# Patient Record
Sex: Female | Born: 1985 | Race: Black or African American | Hispanic: No | Smoking: Never smoker
Health system: Southern US, Community
[De-identification: ages and names within clinical notes are randomized; demographics above are authoritative.]

## PROBLEM LIST (undated history)

## (undated) ENCOUNTER — Inpatient Hospital Stay: Payer: Self-pay

## (undated) DIAGNOSIS — O139 Gestational [pregnancy-induced] hypertension without significant proteinuria, unspecified trimester: Secondary | ICD-10-CM

## (undated) DIAGNOSIS — T8859XA Other complications of anesthesia, initial encounter: Secondary | ICD-10-CM

## (undated) DIAGNOSIS — Z789 Other specified health status: Secondary | ICD-10-CM

## (undated) DIAGNOSIS — B999 Unspecified infectious disease: Secondary | ICD-10-CM

## (undated) DIAGNOSIS — R87629 Unspecified abnormal cytological findings in specimens from vagina: Secondary | ICD-10-CM

## (undated) DIAGNOSIS — D649 Anemia, unspecified: Secondary | ICD-10-CM

## (undated) DIAGNOSIS — R519 Headache, unspecified: Secondary | ICD-10-CM

## (undated) DIAGNOSIS — K089 Disorder of teeth and supporting structures, unspecified: Secondary | ICD-10-CM

## (undated) DIAGNOSIS — F99 Mental disorder, not otherwise specified: Secondary | ICD-10-CM

## (undated) HISTORY — DX: Unspecified infectious disease: B99.9

## (undated) HISTORY — DX: Disorder of teeth and supporting structures, unspecified: K08.9

## (undated) HISTORY — DX: Headache, unspecified: R51.9

## (undated) HISTORY — DX: Anemia, unspecified: D64.9

## (undated) HISTORY — DX: Gestational (pregnancy-induced) hypertension without significant proteinuria, unspecified trimester: O13.9

## (undated) HISTORY — PX: NO PAST SURGERIES: SHX2092

## (undated) HISTORY — DX: Unspecified abnormal cytological findings in specimens from vagina: R87.629

## (undated) HISTORY — DX: Mental disorder, not otherwise specified: F99

## (undated) HISTORY — DX: Other specified health status: Z78.9

## (undated) HISTORY — PX: OTHER SURGICAL HISTORY: SHX169

## (undated) HISTORY — DX: Other complications of anesthesia, initial encounter: T88.59XA

---

## 2007-07-19 ENCOUNTER — Emergency Department: Payer: Self-pay | Admitting: Emergency Medicine

## 2007-11-20 ENCOUNTER — Inpatient Hospital Stay: Payer: Self-pay

## 2012-05-04 ENCOUNTER — Emergency Department: Payer: Self-pay | Admitting: Emergency Medicine

## 2012-08-13 DIAGNOSIS — N879 Dysplasia of cervix uteri, unspecified: Secondary | ICD-10-CM | POA: Insufficient documentation

## 2012-10-20 ENCOUNTER — Encounter: Payer: Self-pay | Admitting: Obstetrics and Gynecology

## 2012-12-04 ENCOUNTER — Encounter: Payer: Self-pay | Admitting: Obstetrics and Gynecology

## 2012-12-05 ENCOUNTER — Encounter: Payer: Self-pay | Admitting: Obstetrics and Gynecology

## 2013-02-19 ENCOUNTER — Encounter: Payer: Self-pay | Admitting: Maternal & Fetal Medicine

## 2013-03-16 ENCOUNTER — Encounter: Payer: Self-pay | Admitting: Maternal and Fetal Medicine

## 2013-03-26 ENCOUNTER — Observation Stay: Payer: Self-pay

## 2013-03-26 LAB — URINALYSIS, COMPLETE
Bilirubin,UR: NEGATIVE
Glucose,UR: NEGATIVE mg/dL (ref 0–75)
Ketone: NEGATIVE
Nitrite: NEGATIVE
Ph: 7 (ref 4.5–8.0)
Protein: NEGATIVE
RBC,UR: NONE SEEN /HPF (ref 0–5)

## 2013-04-01 ENCOUNTER — Observation Stay: Payer: Self-pay | Admitting: Obstetrics and Gynecology

## 2013-04-06 ENCOUNTER — Encounter: Payer: Self-pay | Admitting: Maternal and Fetal Medicine

## 2013-04-15 ENCOUNTER — Observation Stay: Payer: Self-pay | Admitting: Obstetrics and Gynecology

## 2013-04-17 ENCOUNTER — Inpatient Hospital Stay: Payer: Self-pay | Admitting: Obstetrics and Gynecology

## 2013-04-17 LAB — CBC WITH DIFFERENTIAL/PLATELET
Basophil %: 0.5 %
Eosinophil #: 0.1 10*3/uL (ref 0.0–0.7)
HGB: 10.6 g/dL — ABNORMAL LOW (ref 12.0–16.0)
Lymphocyte #: 0.7 10*3/uL — ABNORMAL LOW (ref 1.0–3.6)
MCH: 24.9 pg — ABNORMAL LOW (ref 26.0–34.0)
MCHC: 31.9 g/dL — ABNORMAL LOW (ref 32.0–36.0)
MCV: 78 fL — ABNORMAL LOW (ref 80–100)
Monocyte %: 5.8 %
Neutrophil %: 89.3 %
Platelet: 259 10*3/uL (ref 150–440)
RBC: 4.28 10*6/uL (ref 3.80–5.20)

## 2014-05-07 NOTE — L&D Delivery Note (Signed)
Delivery Note At  a viable female infant was delivered via DOA.  APGAR:8,9 ; weight 2620g .  Precipitous delivery within 1 hour of presentation.  Placenta status: intact, no complications  Anesthesia:  none Episiotomy:  none Lacerations:  none Suture Repair: none Est. Blood Loss (mL):  200 ml  Mom to postpartum.  Baby to Couplet care / Skin to Skin.  Leonette MostJohanna K Black 04/16/2015, 8:19 AM

## 2014-09-14 NOTE — H&P (Signed)
L&D Evaluation:  History:  HPI 41JW [id number] G3P1102 with LMP of ?07/19/12 & EDd of 04/26/13 and US at 10 5/7 wks with EDD of 04/25/[facility] significant for Tdap given at 28 wks, H/O pp hemorrhage, H/O abnormal paps, +chlamydia with TOC neg on 09/25/12 sent from [facility] due to early cx dilation without labor. Pt also had poor weight gain during the pregnancy. No ROM, VB,decreased FM or any UC's   Presents with cx dilation   Patient's Medical History Abnormal Paps, PTD at 36 4/7 wks, +chlamydia,   Patient's Surgical History none   Medications Pre Natal Vitamins   Allergies NKDA, Sulfa   Social History none   Family History Non-Contributory   ROS:  ROS All systems were reviewed.  HEENT, CNS, GI, GU, Respiratory, CV, Renal and Musculoskeletal systems were found to be normal.   Exam:  Vital Signs stable   General no apparent distress   Mental Status clear   Chest clear   Heart normal sinus rhythm, no murmur/gallop/rubs   Abdomen gravid, non-tender   Estimated Fetal Weight Large for gestational age   Edema 1+   Reflexes 1+   Mebranes Intact   FHT normal rate with no decels, reactive NST with 2 accels 15 x 15   Ucx other   Lymph no lymphadenopathy   Plan:  Plan DChome   Comments Pt taken out of work due to advanced cx dilation. Pelvic rest, No sex. note to pt GN:FAOZre:work   Electronic Signatures: Sharee PimpleJones, Caron W (CNM)  (Signed 20-Nov-14 13:44)  Authored: L&D Evaluation   Last Updated: 20-Nov-14 13:44 by Sharee PimpleJones, Caron W (CNM)

## 2014-09-14 NOTE — H&P (Signed)
L&D Evaluation:  History:  HPI 29 yo [id number] with LMP of 04/25/13 at 10 5/7 weeks here with complete dilation and ready for delivery and no paperwork done or reviewed prior to delivery. PNC significant for H/O PTD at 8536 4/7 weeks, h/o PP hemorrhage, h/o abnormal pap, h/o chlamydia, TOC neg, Pt does not have custody of her two kids, Low birth rate for this preg.   Presents with contractions   Patient's Medical History PTD, PP hemorrhage, abnormal paps, +chlamyda wtih treatment, poor wt gain   Patient's Surgical History none   Medications Pre Natal Vitamins   Allergies NKDA   Social History none   Family History Non-Contributory   ROS:  ROS All systems were reviewed.  HEENT, CNS, GI, GU, Respiratory, CV, Renal and Musculoskeletal systems were found to be normal.   Exam:  Vital Signs stable   General no apparent distress   Mental Status clear   Chest clear   Heart normal sinus rhythm, no murmur/gallop/rubs   Back no CVAT   Edema 1+   Reflexes 1+   Clonus negative   Mebranes Ruptured, 0930 clear   FHT normal rate with no decels   Ucx regular   Skin dry   Lymph no lymphadenopathy   Impression:  Impression active labor, IUP at term with active labor   Plan:  Plan monitor contractions and for cervical change, GBS neg   Electronic Signatures: Sharee PimpleJones, Veto Macqueen W (CNM)  (Signed 12-Dec-14 12:21)  Authored: L&D Evaluation   Last Updated: 12-Dec-14 12:21 by Sharee PimpleJones, Loran Fleet W (CNM)

## 2014-11-03 DIAGNOSIS — F4323 Adjustment disorder with mixed anxiety and depressed mood: Secondary | ICD-10-CM | POA: Insufficient documentation

## 2014-11-12 ENCOUNTER — Emergency Department: Payer: Medicaid Other

## 2014-11-12 ENCOUNTER — Emergency Department
Admission: EM | Admit: 2014-11-12 | Discharge: 2014-11-12 | Disposition: A | Discharge status: Home / Self Care | Payer: Medicaid Other | Attending: Emergency Medicine | Admitting: Emergency Medicine

## 2014-11-12 ENCOUNTER — Encounter: Payer: Self-pay | Admitting: Emergency Medicine

## 2014-11-12 DIAGNOSIS — R109 Unspecified abdominal pain: Secondary | ICD-10-CM | POA: Diagnosis not present

## 2014-11-12 DIAGNOSIS — O26899 Other specified pregnancy related conditions, unspecified trimester: Secondary | ICD-10-CM

## 2014-11-12 DIAGNOSIS — O9989 Other specified diseases and conditions complicating pregnancy, childbirth and the puerperium: Secondary | ICD-10-CM | POA: Insufficient documentation

## 2014-11-12 DIAGNOSIS — Z3A15 15 weeks gestation of pregnancy: Secondary | ICD-10-CM | POA: Insufficient documentation

## 2014-11-12 DIAGNOSIS — Z3491 Encounter for supervision of normal pregnancy, unspecified, first trimester: Secondary | ICD-10-CM

## 2014-11-12 LAB — CBC
HCT: 36.4 % (ref 35.0–47.0)
Hemoglobin: 11.8 g/dL — ABNORMAL LOW (ref 12.0–16.0)
MCH: 27 pg (ref 26.0–34.0)
MCHC: 32.3 g/dL (ref 32.0–36.0)
MCV: 83.6 fL (ref 80.0–100.0)
Platelets: 252 10*3/uL (ref 150–440)
RBC: 4.35 MIL/uL (ref 3.80–5.20)
RDW: 15.7 % — AB (ref 11.5–14.5)
WBC: 10.1 10*3/uL (ref 3.6–11.0)

## 2014-11-12 LAB — ABO/RH
ABO/RH(D): O POS
ABO/RH(D): O POS

## 2014-11-12 LAB — HCG, QUANTITATIVE, PREGNANCY: hCG, Beta Chain, Quant, S: 71060 m[IU]/mL — ABNORMAL HIGH (ref <5)

## 2014-11-12 MED ORDER — ACETAMINOPHEN 500 MG PO TABS
ORAL_TABLET | ORAL | Status: AC
  Filled 2014-11-12: qty 2

## 2014-11-12 MED ORDER — ACETAMINOPHEN 500 MG PO TABS
1000.0000 mg | ORAL_TABLET | ORAL | Status: AC
  Administered 2014-11-12: 1000 mg via ORAL

## 2014-11-12 NOTE — ED Notes (Signed)
Pt discharged home after verbalizing understanding of discharge instructions; nad noted. 

## 2014-11-12 NOTE — Discharge Instructions (Signed)
First Trimester of Pregnancy  Follow-up with the health Department on Monday. Return to the ER right away should you develop leak of fluid from the vagina, vaginal bleeding, severe abdominal pain, fever, cramps, weakness, or other new concerns arise.  The first trimester of pregnancy is from week 1 until the end of week 12 (months 1 through 3). A week after a sperm fertilizes an egg, the egg will implant on the wall of the uterus. This embryo will begin to develop into a baby. Genes from you and your partner are forming the baby. The female genes determine whether the baby is a boy or a girl. At 6-8 weeks, the eyes and face are formed, and the heartbeat can be seen on ultrasound. At the end of 12 weeks, all the baby's organs are formed.  Now that you are pregnant, you will want to do everything you can to have a healthy baby. Two of the most important things are to get good prenatal care and to follow your health care provider's instructions. Prenatal care is all the medical care you receive before the baby's birth. This care will help prevent, find, and treat any problems during the pregnancy and childbirth. BODY CHANGES Your body goes through many changes during pregnancy. The changes vary from woman to woman.   You may gain or lose a couple of pounds at first.  You may feel sick to your stomach (nauseous) and throw up (vomit). If the vomiting is uncontrollable, call your health care provider.  You may tire easily.  You may develop headaches that can be relieved by medicines approved by your health care provider.  You may urinate more often. Painful urination may mean you have a bladder infection.  You may develop heartburn as a result of your pregnancy.  You may develop constipation because certain hormones are causing the muscles that push waste through your intestines to slow down.  You may develop hemorrhoids or swollen, bulging veins (varicose veins).  Your breasts may begin to grow  larger and become tender. Your nipples may stick out more, and the tissue that surrounds them (areola) may become darker.  Your gums may bleed and may be sensitive to brushing and flossing.  Dark spots or blotches (chloasma, mask of pregnancy) may develop on your face. This will likely fade after the baby is born.  Your menstrual periods will stop.  You may have a loss of appetite.  You may develop cravings for certain kinds of food.  You may have changes in your emotions from day to day, such as being excited to be pregnant or being concerned that something may go wrong with the pregnancy and baby.  You may have more vivid and strange dreams.  You may have changes in your hair. These can include thickening of your hair, rapid growth, and changes in texture. Some women also have hair loss during or after pregnancy, or hair that feels dry or thin. Your hair will most likely return to normal after your baby is born. WHAT TO EXPECT AT YOUR PRENATAL VISITS During a routine prenatal visit:  You will be weighed to make sure you and the baby are growing normally.  Your blood pressure will be taken.  Your abdomen will be measured to track your baby's growth.  The fetal heartbeat will be listened to starting around week 10 or 12 of your pregnancy.  Test results from any previous visits will be discussed. Your health care provider may ask you:  How  you are feeling.  If you are feeling the baby move.  If you have had any abnormal symptoms, such as leaking fluid, bleeding, severe headaches, or abdominal cramping.  If you have any questions. Other tests that may be performed during your first trimester include:  Blood tests to find your blood type and to check for the presence of any previous infections. They will also be used to check for low iron levels (anemia) and Rh antibodies. Later in the pregnancy, blood tests for diabetes will be done along with other tests if problems  develop.  Urine tests to check for infections, diabetes, or protein in the urine.  An ultrasound to confirm the proper growth and development of the baby.  An amniocentesis to check for possible genetic problems.  Fetal screens for spina bifida and Down syndrome.  You may need other tests to make sure you and the baby are doing well. HOME CARE INSTRUCTIONS  Medicines  Follow your health care provider's instructions regarding medicine use. Specific medicines may be either safe or unsafe to take during pregnancy.  Take your prenatal vitamins as directed.  If you develop constipation, try taking a stool softener if your health care provider approves. Diet  Eat regular, well-balanced meals. Choose a variety of foods, such as meat or vegetable-based protein, fish, milk and low-fat dairy products, vegetables, fruits, and whole grain breads and cereals. Your health care provider will help you determine the amount of weight gain that is right for you.  Avoid raw meat and uncooked cheese. These carry germs that can cause birth defects in the baby.  Eating four or five small meals rather than three large meals a day may help relieve nausea and vomiting. If you start to feel nauseous, eating a few soda crackers can be helpful. Drinking liquids between meals instead of during meals also seems to help nausea and vomiting.  If you develop constipation, eat more high-fiber foods, such as fresh vegetables or fruit and whole grains. Drink enough fluids to keep your urine clear or pale yellow. Activity and Exercise  Exercise only as directed by your health care provider. Exercising will help you:  Control your weight.  Stay in shape.  Be prepared for labor and delivery.  Experiencing pain or cramping in the lower abdomen or low back is a good sign that you should stop exercising. Check with your health care provider before continuing normal exercises.  Try to avoid standing for long periods of  time. Move your legs often if you must stand in one place for a long time.  Avoid heavy lifting.  Wear low-heeled shoes, and practice good posture.  You may continue to have sex unless your health care provider directs you otherwise. Relief of Pain or Discomfort  Wear a good support bra for breast tenderness.   Take warm sitz baths to soothe any pain or discomfort caused by hemorrhoids. Use hemorrhoid cream if your health care provider approves.   Rest with your legs elevated if you have leg cramps or low back pain.  If you develop varicose veins in your legs, wear support hose. Elevate your feet for 15 minutes, 3-4 times a day. Limit salt in your diet. Prenatal Care  Schedule your prenatal visits by the twelfth week of pregnancy. They are usually scheduled monthly at first, then more often in the last 2 months before delivery.  Write down your questions. Take them to your prenatal visits.  Keep all your prenatal visits as directed by  your health care provider. Safety  Wear your seat belt at all times when driving.  Make a list of emergency phone numbers, including numbers for family, friends, the hospital, and police and fire departments. General Tips  Ask your health care provider for a referral to a local prenatal education class. Begin classes no later than at the beginning of month 6 of your pregnancy.  Ask for help if you have counseling or nutritional needs during pregnancy. Your health care provider can offer advice or refer you to specialists for help with various needs.  Do not use hot tubs, steam rooms, or saunas.  Do not douche or use tampons or scented sanitary pads.  Do not cross your legs for long periods of time.  Avoid cat litter boxes and soil used by cats. These carry germs that can cause birth defects in the baby and possibly loss of the fetus by miscarriage or stillbirth.  Avoid all smoking, herbs, alcohol, and medicines not prescribed by your health  care provider. Chemicals in these affect the formation and growth of the baby.  Schedule a dentist appointment. At home, brush your teeth with a soft toothbrush and be gentle when you floss. SEEK MEDICAL CARE IF:   You have dizziness.  You have mild pelvic cramps, pelvic pressure, or nagging pain in the abdominal area.  You have persistent nausea, vomiting, or diarrhea.  You have a bad smelling vaginal discharge.  You have pain with urination.  You notice increased swelling in your face, hands, legs, or ankles. SEEK IMMEDIATE MEDICAL CARE IF:   You have a fever.  You are leaking fluid from your vagina.  You have spotting or bleeding from your vagina.  You have severe abdominal cramping or pain.  You have rapid weight gain or loss.  You vomit blood or material that looks like coffee grounds.  You are exposed to [place] measles and have never had them.  You are exposed to fifth disease or chickenpox.  You develop a severe headache.  You have shortness of breath.  You have any kind of trauma, such as from a fall or a car accident. Document Released: 04/17/2001 Document Revised: 09/07/2013 Document Reviewed: 03/03/2013 [facility] Patient Information 2015 [place], [place]. This information is not intended to replace advice given to you by your health care provider. Make sure you discuss any questions you have with your health care provider.

## 2014-11-12 NOTE — ED Provider Notes (Signed)
[facility] Emergency Department Provider Note  ____________________________________________  Time seen: Approximately 4:49 PM  I have reviewed the triage vital signs and the nursing notes.   HISTORY  Chief Complaint Possible Pregnancy and Abdominal Pain    HPI Toni Black is a 29 y.o. female history of 3 prior pregnancies reports that she is pregnant today with an estimated delivery date of the proximal January 24, putting her roughly around [redacted] weeks pregnant.She is unsure of the date of her last menstrual period.  Last night patient states she did so develop slight crampy lower abdominal pain. No vaginal bleeding or leakage of fluid no fevers or chills. Well today she's been experiencing slight crampiness in the lower abdomen and a feeling as though she needs to "push out the baby".  Pain is described as mild, crampy and intermittent. No diarrhea no vomiting. No chest pain or shortness of breath. He is not used a fertility treatments. No history of previous ectopic. She states she did have an ultrasound that a non-OB imaging center where pictures of the baby were taken.   History reviewed. No pertinent past medical history.  There are no active problems to display for this patient.   History reviewed. No pertinent past surgical history.  No current outpatient prescriptions on file.  Allergies Review of patient's allergies indicates no known allergies.  No family history on file.  Social History History  Substance Use Topics  . Smoking status: Never Smoker   . Smokeless tobacco: Not on file  . Alcohol Use: No    Review of Systems Constitutional: No fever/chills Eyes: No visual changes. ENT: No sore throat. Cardiovascular: Denies chest pain. Respiratory: Denies shortness of breath. Gastrointestinal: See history of present illness No nausea, no vomiting.  No diarrhea.  No constipation. Genitourinary: Negative for dysuria. Musculoskeletal:  Negative for back pain. Skin: Negative for rash. Neurological: Negative for headaches, focal weakness or numbness.  10-point ROS otherwise negative.  ____________________________________________   PHYSICAL EXAM:  VITAL SIGNS: ED Triage Vitals  Enc Vitals Group     BP 11/12/14 1533 114/69 mmHg     Pulse Rate 11/12/14 1533 118     Resp --      Temp 11/12/14 1533 98.3 F (36.8 C)     Temp Source 11/12/14 1533 Oral     SpO2 11/12/14 1533 99 %     Weight 11/12/14 1533 145 lb (65.772 kg)     Height 11/12/14 1533  (1.549 m)     Head Cir --      Peak Flow --      Pain Score 11/12/14 1538 5     Pain Loc --      Pain Edu? --      Excl. in GC? --     Constitutional: Alert and oriented. Well appearing and in no acute distress. Eyes: Conjunctivae are normal. PERRL. EOMI. Head: Atraumatic. Nose: No congestion/rhinnorhea. Mouth/Throat: Mucous membranes are moist.  Oropharynx non-erythematous. Neck: No stridor.   Cardiovascular: Normal rate, regular rhythm. Grossly normal heart sounds.  Good peripheral circulation. Respiratory: Normal respiratory effort.  No retractions. Lungs CTAB. Gastrointestinal: Soft and nontender except for some very minimal tenderness in the suprapubic region without rebound or guarding. There is a possible fundus felt approximately the midpoint between the pubic brim and umbilicus.  No distention. No abdominal bruits. No CVA tenderness. Musculoskeletal: No lower extremity tenderness nor edema.  No joint effusions. Neurologic:  Normal speech and language. No gross focal neurologic  deficits are appreciated. Speech is normal. No gait instability. Skin:  Skin is warm, dry and intact. No rash noted. Psychiatric: Mood and affect are normal. Speech and behavior are normal.  ____________________________________________   LABS (all labs ordered are listed, but only abnormal results are displayed)  Labs Reviewed  HCG, QUANTITATIVE, PREGNANCY - Abnormal; Notable  for the following:    hCG, Beta Chain, Quant, S 71060 (*)    All other components within normal limits  CBC - Abnormal; Notable for the following:    Hemoglobin 11.8 (*)    RDW 15.7 (*)    All other components within normal limits  POC URINE PREG, ED  ABO/RH  ABO/RH   ____________________________________________  EKG   ____________________________________________  RADIOLOGY  US OB Limited (Final result) Result time: 11/12/14 17:48:02   Procedure changed from US OB Comp Less 14 Wks      Final result by Rad Results In Interface (11/12/14 17:48:02)   Narrative:   CLINICAL DATA: 29 year old pregnant female with pelvic pain and lower abdominal cramping.  EXAM: LIMITED OBSTETRIC ULTRASOUND  FINDINGS: Number of Fetuses: 1  Heart Rate: 155 bpm  Movement: Yes  Presentation: Breech  Placental Location: Fundal  Previa: No  Amniotic Fluid (Subjective): Within normal limits.  BPD: 2.99cm 15w 3d  MATERNAL FINDINGS:  Cervix: Appears closed.  Uterus/Adnexae: No abnormality visualized.  IMPRESSION: Single living intrauterine gestation with estimated gestational age of [redacted] weeks 3 days by this ultrasound. No abnormalities identified.  This exam is performed on an emergent basis and does not comprehensively evaluate fetal size, dating, or anatomy; follow-up complete OB US should be considered if further fetal assessment is warranted.    ____________________________________________   PROCEDURES  Procedure(s) performed: None  Critical Care performed: No  ____________________________________________   INITIAL IMPRESSION / ASSESSMENT AND PLAN / ED COURSE  Pertinent labs & imaging results that were available during my care of the patient were reviewed by me and considered in my medical decision making (see chart for details).  G4 P3 currently approximated at about 11 weeks and 2 days based on a projected due date of January 25 of the patient  reports. Patient is having lower crampy abdominal pain. No fevers or chills. Reassuring examination without no evidence of peritonitis or right lower quadrant pain. At this point, we will obtain ultrasound to evaluate for etiology of pain which is likely of a GYN nature. She has no infectious symptoms. No urinary symptoms. No vaginal discharge or loss of fluid. Rule out ectopic pregnancy, consideration is given for possible signs of an terms of miscarriage, though we cannot be sure at this time.   ----------------------------------------- 7:19 PM on 11/12/2014 -----------------------------------------  She reports her symptoms are resolved. Ultrasound now demonstrates a 15 week 3 day viable intrauterine gestation. Discussed with the patient return precautions and follow-up care. She'll follow-up with the health Department on Monday.  ____________________________________________   FINAL CLINICAL IMPRESSION(S) / ED DIAGNOSES  Final diagnoses:  First trimester pregnancy      Sharyn CreamerMark Julena Barbour, MD 11/12/14 1921

## 2014-11-12 NOTE — ED Notes (Addendum)
Pt reports 4 months pregnant, reports feeling like she has to "push"; pt denies any discharge or bleeding. Pt estimated due date in the end of January. Pt reports still having lower abdominal cramping since last night. Pt P4, G3. Pt reports she's seen at the health department.

## 2014-11-17 LAB — POCT PREGNANCY, URINE: Preg Test, Ur: POSITIVE — AB

## 2014-12-15 ENCOUNTER — Other Ambulatory Visit: Payer: Self-pay | Admitting: Advanced Practice Midwife

## 2014-12-15 DIAGNOSIS — Z3492 Encounter for supervision of normal pregnancy, unspecified, second trimester: Secondary | ICD-10-CM

## 2014-12-20 ENCOUNTER — Ambulatory Visit: Payer: Medicaid Other

## 2014-12-21 ENCOUNTER — Ambulatory Visit
Admission: RE | Admit: 2014-12-21 | Discharge: 2014-12-21 | Disposition: A | Discharge status: Home / Self Care | Payer: Medicaid Other | Source: Ambulatory Visit | Attending: Advanced Practice Midwife | Admitting: Advanced Practice Midwife

## 2014-12-21 DIAGNOSIS — Z3492 Encounter for supervision of normal pregnancy, unspecified, second trimester: Secondary | ICD-10-CM | POA: Diagnosis present

## 2015-01-14 IMAGING — US US OB FOLLOW-UP
1 series · 14 of 28 positions shown · non-contrast
Comparison: none

[Series 1: us ob follow-up · 0.23mm/px · 14 of 51 slices shown]
[im 2/51]
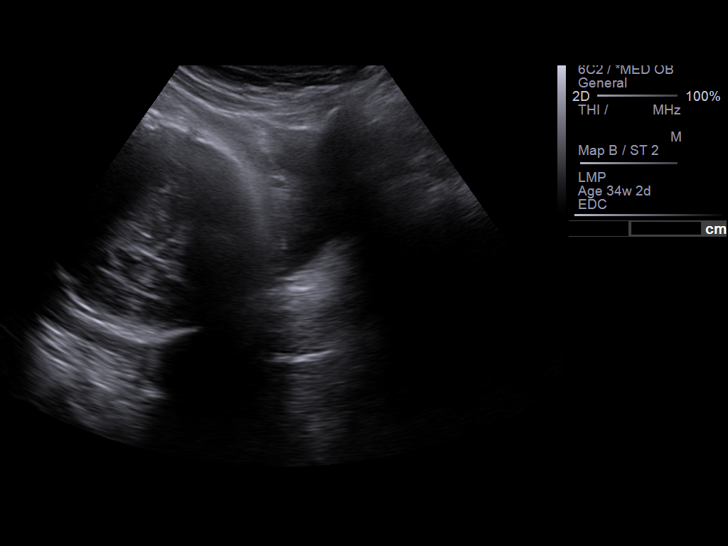
[im 6/51]
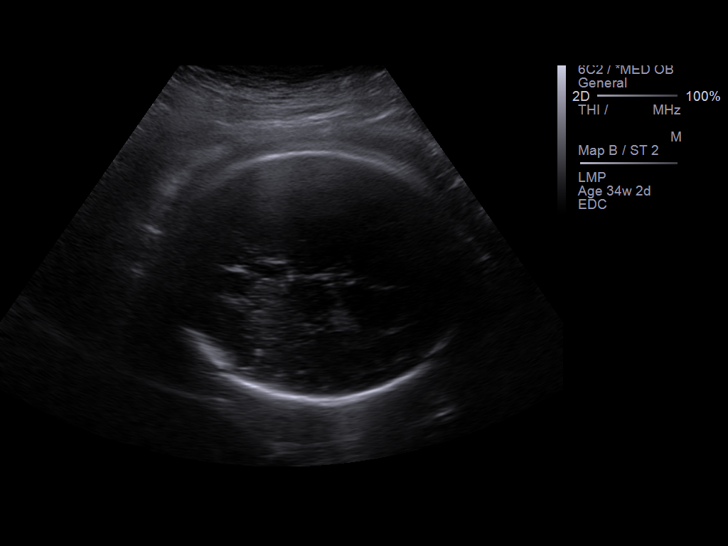
[im 10/51]
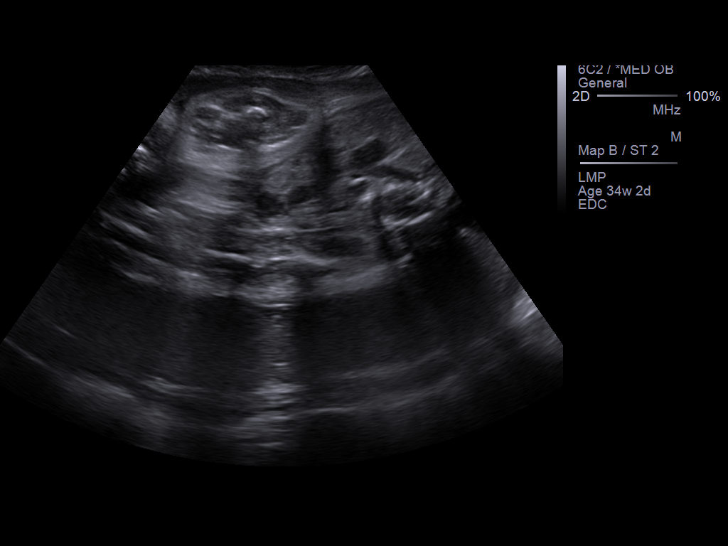
[im 13/51]
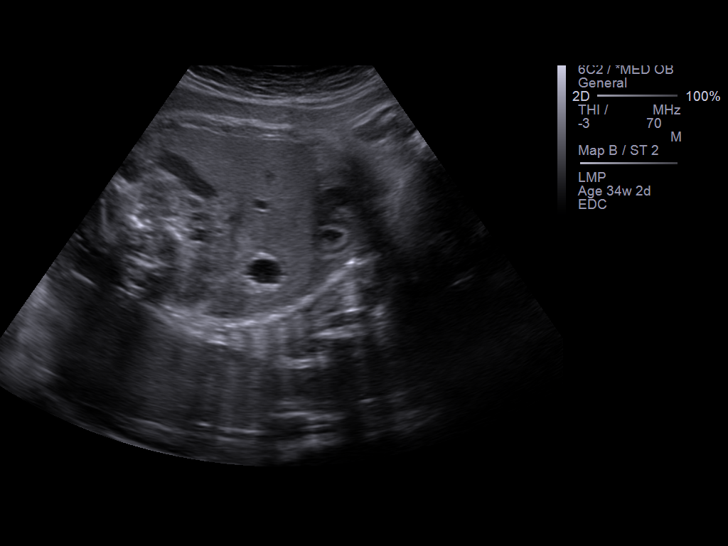
[im 17/51]
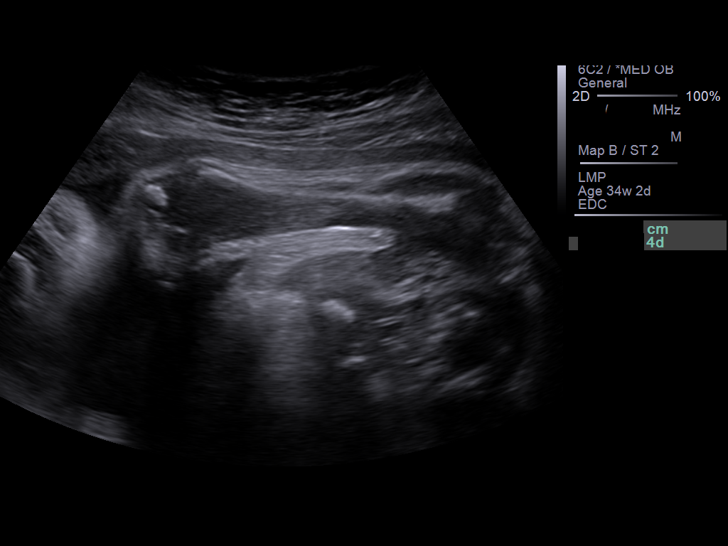
[im 21/51]
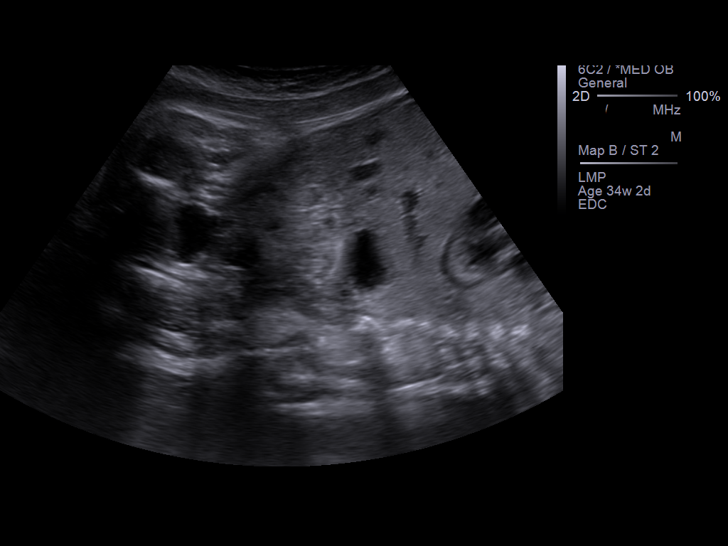
[im 25/51]
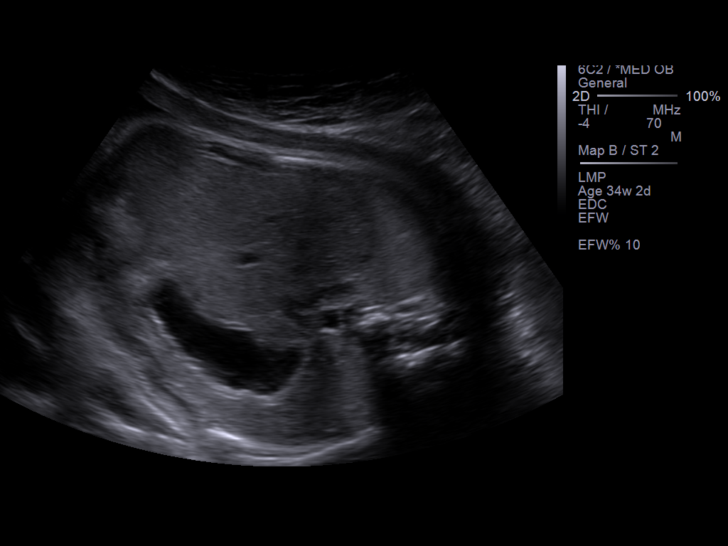
[im 28/51]
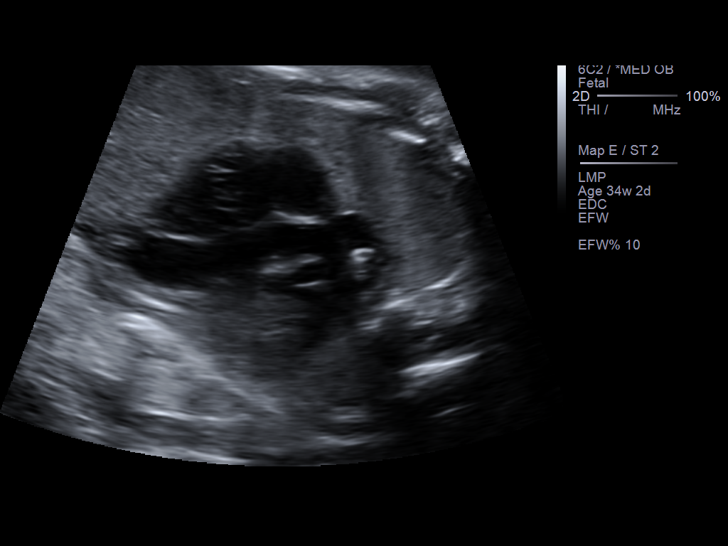
[im 32/51]
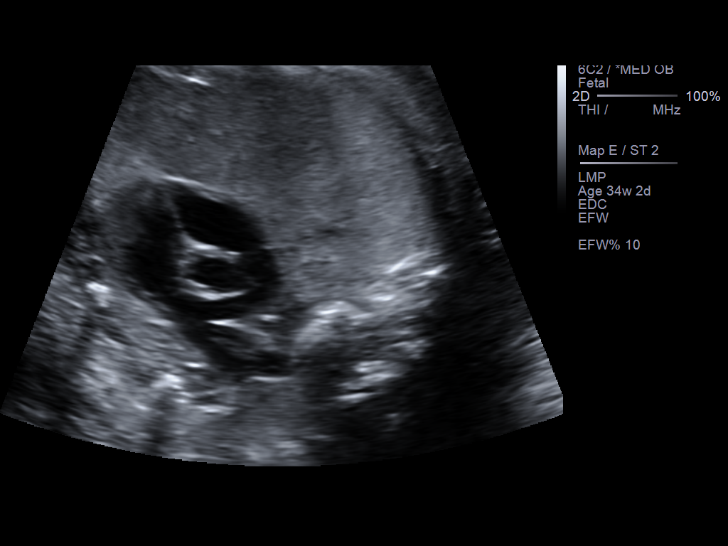
[im 36/51]
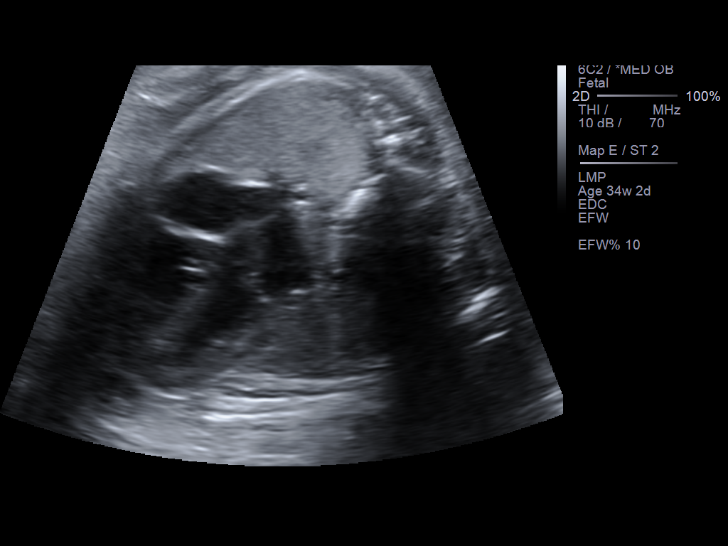
[im 39/51]
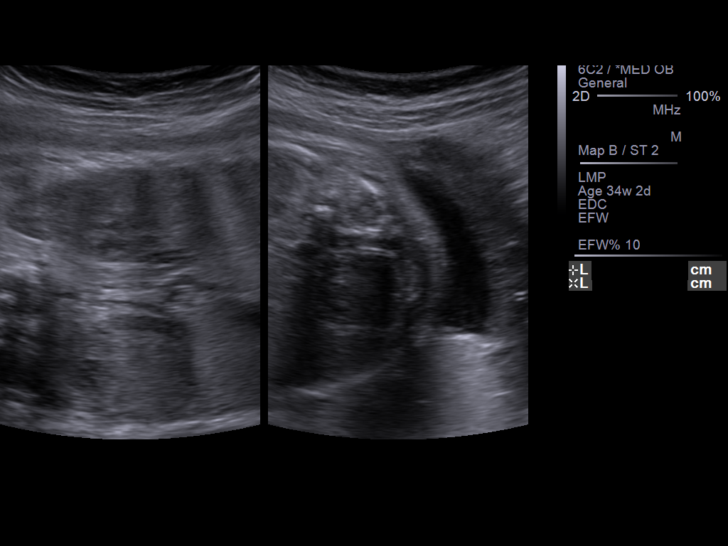
[im 43/51]
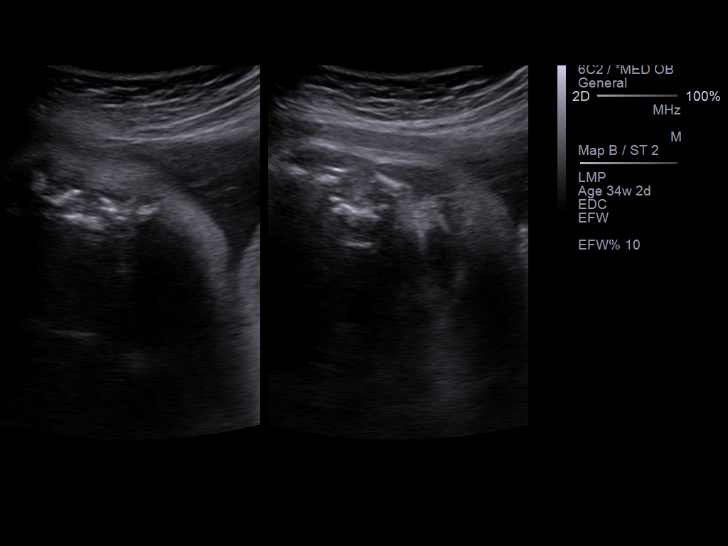
[im 47/51]
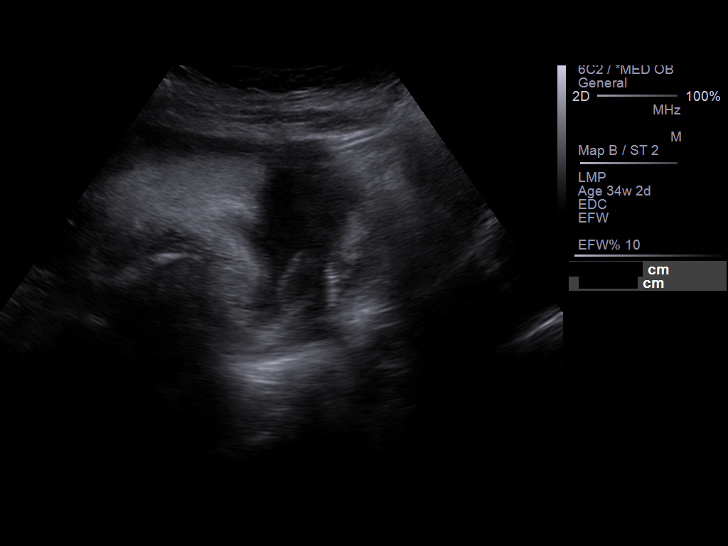
[im 51/51]
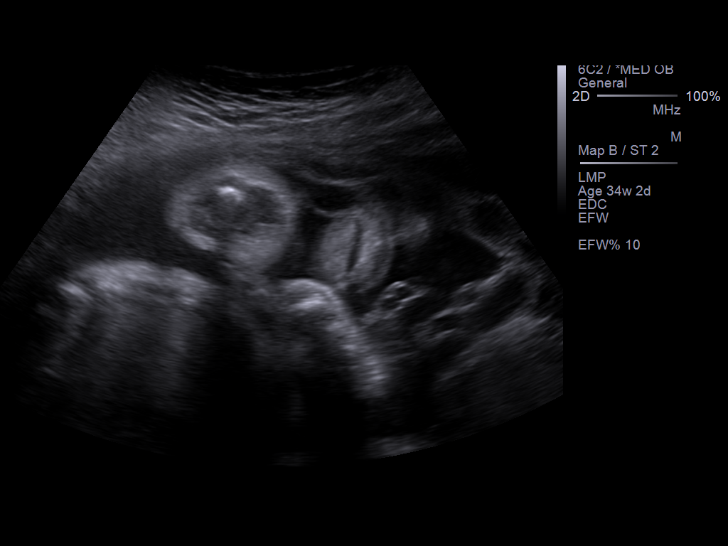

[14 of 28 positions shown; findings below may reference images not displayed]

IMAGES IMPORTED FROM THE [employer] WORKFLOW SYSTEM
NO DICTATION FOR STUDY

## 2015-02-23 ENCOUNTER — Encounter: Payer: Self-pay | Admitting: *Deleted

## 2015-02-23 ENCOUNTER — Observation Stay
Admission: EM | Admit: 2015-02-23 | Discharge: 2015-02-23 | Disposition: A | Discharge status: Home / Self Care | Payer: Medicaid Other | Attending: Obstetrics and Gynecology | Admitting: Obstetrics and Gynecology

## 2015-02-23 DIAGNOSIS — R102 Pelvic and perineal pain: Secondary | ICD-10-CM | POA: Insufficient documentation

## 2015-02-23 DIAGNOSIS — O26893 Other specified pregnancy related conditions, third trimester: Secondary | ICD-10-CM | POA: Diagnosis not present

## 2015-02-23 DIAGNOSIS — O26899 Other specified pregnancy related conditions, unspecified trimester: Secondary | ICD-10-CM

## 2015-02-23 DIAGNOSIS — Z3A29 29 weeks gestation of pregnancy: Secondary | ICD-10-CM | POA: Insufficient documentation

## 2015-02-23 MED ORDER — PRENATAL MULTIVITAMIN CH
1.0000 | ORAL_TABLET | Freq: Every day | ORAL | Status: DC
  Filled 2015-02-23: qty 1

## 2015-02-23 MED ORDER — ACETAMINOPHEN 325 MG PO TABS: 650.0000 mg | ORAL_TABLET | ORAL | Status: DC | PRN

## 2015-02-23 MED ORDER — CALCIUM CARBONATE ANTACID 500 MG PO CHEW: 2.0000 | CHEWABLE_TABLET | ORAL | Status: DC | PRN

## 2015-02-23 MED ORDER — ZOLPIDEM TARTRATE 5 MG PO TABS: 5.0000 mg | ORAL_TABLET | Freq: Every evening | ORAL | Status: DC | PRN

## 2015-02-23 MED ORDER — DOCUSATE SODIUM 100 MG PO CAPS: 100.0000 mg | ORAL_CAPSULE | Freq: Every day | ORAL | Status: DC

## 2015-02-23 NOTE — Final Progress Note (Signed)
Physician Final Progress Note  Patient ID: Toni Black MRN: [id number] DOB/AGE: 09-16-1985 29 y.o.  Admit date: 02/23/2015 Admitting provider: Suzy Bouchardhomas J Schermerhorn, MD Discharge date: 02/23/2015   Admission Diagnoses: [id number] at 29+5 weeks sent by [facility] for pelvic pressure . Prior h/o PTD . She is not on 17P . Pt declined . No LOF , No vaginal bleeding . No N/ V   Discharge Diagnoses: contraction , no PTL  Active Problems:   * No active hospital problems. *    Consults: None  Significant Findings/ Diagnostic Studies: none  Procedures:none Discharge Condition: good  Disposition: 01-Home or Self Care, pt will f/up with [facility] for prenatal care  Precautions given to pt to RTC   Diet: Regular diet  Discharge Activity: Activity as tolerated     Medication List    ASK your doctor about these medications        multivitamin-prenatal 27-0.8 MG Tabs tablet  Take 1 tablet by mouth daily at 12 noon.         Total time spent taking care of this patient: 20 minutes  Signed: SCHERMERHORN,THOMAS 02/23/2015, 2:28 PM

## 2015-02-23 NOTE — Progress Notes (Signed)
Patient ID: Toni BrassJasmine Black, female   DOB: August 03, 1985, 29 y.o.   MRN: [id number] Toni Black August 03, 1985 G4P [id number] Unknown presents for pelvic pressure  No LOF , no  vaginal bleeding ,prior h/o PTD at 35 weeks . Pt of the [facility]  PMHX anemia  PSHX none  POBX: ptd at 35 weeks , 37+38 week svd  ROS unremarkable  Allergies: NKDA Meds PNV  Social no etoh , tobacco   O;BP 112/74 mmHg  Pulse 106  Temp(Src) 98 F (36.7 C) (Axillary)  Ht 5\' 1"  (1.549 m)  Wt 140 lb (63.504 kg)  BMI 26.47 kg/m2 ABDsoft  NT no rebound  CX ext os open , internal closed / 20% / vtx / blt  NSTreactive , no decle , uterine irritability Labs: none A: No evidence of PTL , reassuring fetal monitoring . Pt is at risk of PTD given her hx.  P:precautions given to the pt to RTC if > 5 ctx  In hour. Fup appt at [facility]  in 1 week

## 2015-02-23 NOTE — Discharge Instructions (Signed)
Keep scheduled appt next week, Wednesday, October 26, at 0815.  Call the Maternity Clerical section at [phone] to discuss work release needed for job at [employer] on [address] due to heavy lifting of 30-35 lb with job requirements.  Ellison Carwin Lauryn Lizardi RNC

## 2015-02-23 NOTE — Plan of Care (Signed)
[facility] called to reschedule appt for pt for next week per Dr. Feliberto GottronSchermerhorn order.  Pt has next appt Wed, Oct 26 at 0815.  Also discussed with clerk that pt may need a work release form from them per our MD due to the fact that pt works at [employer] on [address] 30-35 lbs with her job description. Pt is to call the dept tomorrow to discuss this with them.  Ellison Carwin Envy Meno RNC

## 2015-03-25 ENCOUNTER — Encounter: Payer: Self-pay | Admitting: *Deleted

## 2015-03-25 ENCOUNTER — Observation Stay
Admission: EM | Admit: 2015-03-25 | Discharge: 2015-03-25 | Disposition: A | Discharge status: Home / Self Care | Payer: Medicaid Other | Attending: Obstetrics and Gynecology | Admitting: Obstetrics and Gynecology

## 2015-03-25 DIAGNOSIS — O479 False labor, unspecified: Secondary | ICD-10-CM | POA: Diagnosis present

## 2015-03-25 DIAGNOSIS — O4700 False labor before 37 completed weeks of gestation, unspecified trimester: Secondary | ICD-10-CM | POA: Diagnosis present

## 2015-03-25 DIAGNOSIS — Z3A34 34 weeks gestation of pregnancy: Secondary | ICD-10-CM | POA: Diagnosis not present

## 2015-03-25 NOTE — Discharge Summary (Signed)
OB ATTENDING NOTE:  LMP: 07/30/14 EDD: 05/06/15  29yo [id number] @ 34+0wks here with ctx. No lof, no vb. +FM. No other complaints.   APC: [facility] 1. H/o PTD @ 35wks, not on 17-OHP   PMHX anemia  PSHX none  POBX: ptd at 35 weeks , 37+38 week svd  ROS unremarkable  Allergies: NKDA Meds PNV  Social no etoh , tobacco  O: Filed Vitals:   03/25/15 1655  BP: 111/71  Pulse: 121  Temp: 98.3 F (36.8 C)  Resp: 18   GEN: NAD, comfortable ABD: soft, gravid, vtx  SVE: 2/long/post --> repeat 2 hours later, same  EFM: 150 mod var, +accel, no decel Toco: irritable  A/P: 39VF [id number] G4P3013 @ 34+0wks here with preterm ctx, not in labor.  - precautions given - next appt Tuesday  Ala DachJohanna K Halfon, MD

## 2015-03-25 NOTE — Progress Notes (Signed)
Dr. Tildon HuskyHalfon in with patient.

## 2015-03-25 NOTE — OB Triage Note (Signed)
Pt. came to hospital from work, contractions.

## 2015-03-25 NOTE — Progress Notes (Signed)
Pt was given d/c inst and verbalized understanding.  She was then d/c home in stable condition withFOB

## 2015-03-30 ENCOUNTER — Other Ambulatory Visit: Payer: Self-pay | Admitting: Advanced Practice Midwife

## 2015-03-30 DIAGNOSIS — O2613 Low weight gain in pregnancy, third trimester: Secondary | ICD-10-CM

## 2015-04-01 ENCOUNTER — Observation Stay
Admission: RE | Admit: 2015-04-01 | Discharge: 2015-04-01 | Disposition: A | Discharge status: Home / Self Care | Payer: Medicaid Other | Attending: Obstetrics and Gynecology | Admitting: Obstetrics and Gynecology

## 2015-04-01 DIAGNOSIS — O4700 False labor before 37 completed weeks of gestation, unspecified trimester: Secondary | ICD-10-CM | POA: Diagnosis present

## 2015-04-01 DIAGNOSIS — Z3A35 35 weeks gestation of pregnancy: Secondary | ICD-10-CM | POA: Insufficient documentation

## 2015-04-01 DIAGNOSIS — O479 False labor, unspecified: Secondary | ICD-10-CM | POA: Diagnosis present

## 2015-04-01 NOTE — Discharge Instructions (Signed)
[facility] if regular contractions, leaking fluid, vaginal bleeding or decrease in fetal movement.

## 2015-04-13 ENCOUNTER — Observation Stay
Admission: RE | Admit: 2015-04-13 | Discharge: 2015-04-13 | Disposition: A | Discharge status: Home / Self Care | Payer: Medicaid Other | Attending: Obstetrics and Gynecology | Admitting: Obstetrics and Gynecology

## 2015-04-13 DIAGNOSIS — O479 False labor, unspecified: Secondary | ICD-10-CM | POA: Diagnosis present

## 2015-04-13 DIAGNOSIS — Z3A36 36 weeks gestation of pregnancy: Secondary | ICD-10-CM | POA: Diagnosis not present

## 2015-04-13 DIAGNOSIS — O4700 False labor before 37 completed weeks of gestation, unspecified trimester: Secondary | ICD-10-CM | POA: Diagnosis present

## 2015-04-13 HISTORY — DX: Other specified health status: Z78.9

## 2015-04-13 MED ORDER — ACETAMINOPHEN 325 MG PO TABS: 650.0000 mg | ORAL_TABLET | ORAL | Status: DC | PRN

## 2015-04-13 MED ORDER — HYDROCODONE-ACETAMINOPHEN 5-325 MG PO TABS
1.0000 | ORAL_TABLET | Freq: Once | ORAL | Status: AC
  Administered 2015-04-13: 1 via ORAL
  Filled 2015-04-13: qty 1

## 2015-04-13 NOTE — Progress Notes (Signed)
Dawna PartN. Tiera Mensinger RN takes discharge papers to get copies after discussing DC instructions with pt. This nurse told pt to get dressed while I bring papers back for signature. Pt left OB observation  Room 3 before this nurse was able to get back to have pt sign her papers. Message left with pt's contact number regarding this

## 2015-04-13 NOTE — Progress Notes (Signed)
Patient ID: Toni Black, female   DOB: [birth year], 29 y.o.   MRN: [id number] Toni Black [birth year] G4 P2103 @ 3890w5d presents for back pain  No LOF , no vaginal bleeding ,H/o precipitous labor . H/o PTD . No 17 P in this preg  PMHX : Anemia  PSHX : none  Ros unremarkable  Meds PNV , iron  Allergies NKDA O;BP 109/69 mmHg  Pulse 123  Temp(Src) 98.2 F (36.8 C) (Oral)  Resp 20  Ht 5\' 1"  (1.549 m)  Wt 141 lb (63.957 kg)  BMI 26.66 kg/m2  LMP  (LMP Unknown) ABDsoft NT  CX 3-4 cm / 50 / Blt  vtx NST140-150 , + reactive , no decels  Labs: none  A: preterm CTX . At risk for PTL / PTD given prior hx P:Po norco + po diet . Recheck in 90 min . If still contracting I would keep overnight .

## 2015-04-13 NOTE — OB Triage Note (Addendum)
Mrs. Toni Black arrived at the hospital via EMS.  She states that she has been contracting since last night and they got worse around 2 pm today. She reports + FM and denies LOF, bleeding, headaches, N/V and visual disturbances. She has a hx of preterm delivery, precipitous labor, and postpartum hemorrhage.  History of chlamydia earlier in the pregnancy.

## 2015-04-13 NOTE — OB Triage Note (Signed)
Discussed discharge with pt. Pt states that back pain is better since taking Norco medication. Pt states that she lives close to hospital within 15-20 minutes and would like to go home. Dawna PartN. Glenwood Revoir RN discussed with pt the importance of coming back into hospital quickly if pt is starting to have contractions. Pt feels comfortable going home and states that contractions have almost completely gone away at this time. Discharge papers reviewed and pt verbalizes understanding.

## 2015-04-14 ENCOUNTER — Ambulatory Visit
Admission: RE | Admit: 2015-04-14 | Discharge: 2015-04-14 | Disposition: A | Discharge status: Home / Self Care | Payer: Medicaid Other | Source: Ambulatory Visit | Attending: Advanced Practice Midwife | Admitting: Advanced Practice Midwife

## 2015-04-14 DIAGNOSIS — D509 Iron deficiency anemia, unspecified: Secondary | ICD-10-CM | POA: Insufficient documentation

## 2015-04-14 DIAGNOSIS — O2613 Low weight gain in pregnancy, third trimester: Secondary | ICD-10-CM

## 2015-04-14 DIAGNOSIS — Z36 Encounter for antenatal screening of mother: Secondary | ICD-10-CM | POA: Diagnosis present

## 2015-04-14 DIAGNOSIS — Z3A35 35 weeks gestation of pregnancy: Secondary | ICD-10-CM | POA: Diagnosis not present

## 2015-04-14 DIAGNOSIS — O0993 Supervision of high risk pregnancy, unspecified, third trimester: Secondary | ICD-10-CM | POA: Insufficient documentation

## 2015-04-14 DIAGNOSIS — O99013 Anemia complicating pregnancy, third trimester: Secondary | ICD-10-CM | POA: Insufficient documentation

## 2015-04-16 ENCOUNTER — Inpatient Hospital Stay
Admission: EM | Admit: 2015-04-16 | Discharge: 2015-04-18 | DRG: 775 | Disposition: A | Discharge status: Home / Self Care | Payer: Medicaid Other | Attending: Obstetrics and Gynecology | Admitting: Obstetrics and Gynecology

## 2015-04-16 ENCOUNTER — Encounter: Payer: Self-pay | Admitting: *Deleted

## 2015-04-16 DIAGNOSIS — O99824 Streptococcus B carrier state complicating childbirth: Secondary | ICD-10-CM | POA: Diagnosis present

## 2015-04-16 LAB — RAPID HIV SCREEN (HIV 1/2 AB+AG)
HIV 1/2 Antibodies: NONREACTIVE
HIV-1 P24 Antigen - HIV24: NONREACTIVE

## 2015-04-16 LAB — CBC
HCT: 31 % — ABNORMAL LOW (ref 35.0–47.0)
HEMOGLOBIN: 9.6 g/dL — AB (ref 12.0–16.0)
MCH: 23.7 pg — ABNORMAL LOW (ref 26.0–34.0)
MCHC: 30.9 g/dL — AB (ref 32.0–36.0)
MCV: 76.6 fL — ABNORMAL LOW (ref 80.0–100.0)
Platelets: 260 10*3/uL (ref 150–440)
RBC: 4.04 MIL/uL (ref 3.80–5.20)
RDW: 14.7 % — ABNORMAL HIGH (ref 11.5–14.5)
WBC: 14.7 10*3/uL — AB (ref 3.6–11.0)

## 2015-04-16 LAB — TYPE AND SCREEN
ABO/RH(D): O POS
ANTIBODY SCREEN: NEGATIVE

## 2015-04-16 MED ORDER — LACTATED RINGERS IV SOLN: 500.0000 mL | INTRAVENOUS | Status: DC | PRN

## 2015-04-16 MED ORDER — ONDANSETRON HCL 4 MG/2ML IJ SOLN: 4.0000 mg | INTRAMUSCULAR | Status: DC | PRN

## 2015-04-16 MED ORDER — SIMETHICONE 80 MG PO CHEW: 80.0000 mg | CHEWABLE_TABLET | ORAL | Status: DC | PRN

## 2015-04-16 MED ORDER — OXYCODONE-ACETAMINOPHEN 5-325 MG PO TABS: 1.0000 | ORAL_TABLET | ORAL | Status: DC | PRN

## 2015-04-16 MED ORDER — OXYCODONE-ACETAMINOPHEN 5-325 MG PO TABS: 2.0000 | ORAL_TABLET | ORAL | Status: DC | PRN

## 2015-04-16 MED ORDER — SODIUM CHLORIDE 0.9 % IJ SOLN: 3.0000 mL | Freq: Two times a day (BID) | INTRAMUSCULAR | Status: DC

## 2015-04-16 MED ORDER — TETANUS-DIPHTH-ACELL PERTUSSIS 5-2.5-18.5 LF-MCG/0.5 IM SUSP: 0.5000 mL | Freq: Once | INTRAMUSCULAR | Status: DC

## 2015-04-16 MED ORDER — DIPHENHYDRAMINE HCL 25 MG PO CAPS: 25.0000 mg | ORAL_CAPSULE | Freq: Four times a day (QID) | ORAL | Status: DC | PRN

## 2015-04-16 MED ORDER — ONDANSETRON HCL 4 MG PO TABS
4.0000 mg | ORAL_TABLET | ORAL | Status: DC | PRN
Start: 2015-04-16 — End: 2015-04-18

## 2015-04-16 MED ORDER — ZOLPIDEM TARTRATE 5 MG PO TABS: 5.0000 mg | ORAL_TABLET | Freq: Every evening | ORAL | Status: DC | PRN

## 2015-04-16 MED ORDER — PRENATAL MULTIVITAMIN CH
1.0000 | ORAL_TABLET | Freq: Every day | ORAL | Status: DC
Start: 2015-04-16 — End: 2015-04-18
  Administered 2015-04-17 – 2015-04-18 (×2): 1 via ORAL
  Filled 2015-04-16 (×2): qty 1

## 2015-04-16 MED ORDER — ONDANSETRON HCL 4 MG/2ML IJ SOLN: 4.0000 mg | Freq: Four times a day (QID) | INTRAMUSCULAR | Status: DC | PRN

## 2015-04-16 MED ORDER — BENZOCAINE-MENTHOL 20-0.5 % EX AERO: 1.0000 "application " | INHALATION_SPRAY | CUTANEOUS | Status: DC | PRN

## 2015-04-16 MED ORDER — BUTORPHANOL TARTRATE 1 MG/ML IJ SOLN: 1.0000 mg | INTRAMUSCULAR | Status: DC | PRN

## 2015-04-16 MED ORDER — SODIUM CHLORIDE 0.9 % IV SOLN
2.0000 g | Freq: Once | INTRAVENOUS | Status: DC
  Administered 2015-04-16: 2 g via INTRAVENOUS

## 2015-04-16 MED ORDER — SODIUM CHLORIDE 0.9 % IV SOLN: 250.0000 mL | INTRAVENOUS | Status: DC | PRN

## 2015-04-16 MED ORDER — ACETAMINOPHEN 325 MG PO TABS: 650.0000 mg | ORAL_TABLET | ORAL | Status: DC | PRN

## 2015-04-16 MED ORDER — OXYTOCIN 40 UNITS IN LACTATED RINGERS INFUSION - SIMPLE MED
62.5000 mL/h | INTRAVENOUS | Status: DC | PRN
  Filled 2015-04-16: qty 1000

## 2015-04-16 MED ORDER — OXYTOCIN 40 UNITS IN LACTATED RINGERS INFUSION - SIMPLE MED: 62.5000 mL/h | INTRAVENOUS | Status: DC

## 2015-04-16 MED ORDER — OXYTOCIN BOLUS FROM INFUSION
500.0000 mL | INTRAVENOUS | Status: DC
  Administered 2015-04-16: 500 mL via INTRAVENOUS

## 2015-04-16 MED ORDER — SENNOSIDES-DOCUSATE SODIUM 8.6-50 MG PO TABS
2.0000 | ORAL_TABLET | ORAL | Status: DC
  Administered 2015-04-16 – 2015-04-17 (×2): 2 via ORAL
  Filled 2015-04-16 (×2): qty 2

## 2015-04-16 MED ORDER — LANOLIN HYDROUS EX OINT: TOPICAL_OINTMENT | CUTANEOUS | Status: DC | PRN

## 2015-04-16 MED ORDER — MEDROXYPROGESTERONE ACETATE 150 MG/ML IM SUSP
150.0000 mg | INTRAMUSCULAR | Status: AC | PRN
  Administered 2015-04-18: 150 mg via INTRAMUSCULAR
  Filled 2015-04-16: qty 1

## 2015-04-16 MED ORDER — CITRIC ACID-SODIUM CITRATE 334-500 MG/5ML PO SOLN: 30.0000 mL | ORAL | Status: DC | PRN

## 2015-04-16 MED ORDER — WITCH HAZEL-GLYCERIN EX PADS: 1.0000 "application " | MEDICATED_PAD | CUTANEOUS | Status: DC | PRN

## 2015-04-16 MED ORDER — IBUPROFEN 600 MG PO TABS
600.0000 mg | ORAL_TABLET | Freq: Four times a day (QID) | ORAL | Status: DC
  Filled 2015-04-16: qty 1

## 2015-04-16 MED ORDER — LACTATED RINGERS IV SOLN
INTRAVENOUS | Status: DC
  Administered 2015-04-16: 07:00:00 via INTRAVENOUS

## 2015-04-16 MED ORDER — DIBUCAINE 1 % RE OINT: 1.0000 "application " | TOPICAL_OINTMENT | RECTAL | Status: DC | PRN

## 2015-04-16 MED ORDER — LIDOCAINE HCL (PF) 1 % IJ SOLN
30.0000 mL | INTRAMUSCULAR | Status: DC | PRN
  Filled 2015-04-16: qty 30

## 2015-04-16 MED ORDER — SODIUM CHLORIDE 0.9 % IJ SOLN: 3.0000 mL | INTRAMUSCULAR | Status: DC | PRN

## 2015-04-16 NOTE — Progress Notes (Signed)
San JettyLaurie Neese, Rn notified

## 2015-04-16 NOTE — H&P (Signed)
OB ATTENDING NOTE:   LMP: 07/30/14 EDD: 05/06/15  29yo [id number] @ 37+1wks here with ctx. No lof, no vb. +FM. No other complaints.   APC: ACHD 1. H/o PTD @ 35wks, not on 17-OHP   PMHX anemia  PSHX none  POBX: ptd at 35 weeks , 37+38 week svd  ROS unremarkable  Allergies: NKDA Meds PNV  Social no etoh , tobacco  O: Filed Vitals:   03/25/15 1655  BP: 111/71  Pulse: 121  Temp: 98.3 F (36.8 C)  Resp: 18   GEN: NAD, comfortable ABD: soft, gravid, vtx  SVE: fully dilated, bulging bag  EFM: 150 mod var, +accel, no decel Toco: irritable  A/P: A/P: 46WU [id number] G4P3013 @ 37+1wks in active labor. GBS+ - Amp ordered due to advanced labor, running - cont efm toco - depo for BCM - anticipate NSVD  Ala DachJohanna K Halfon, MD

## 2015-04-17 LAB — CHLAMYDIA/NGC RT PCR (ARMC ONLY)
CHLAMYDIA TR: NOT DETECTED
N GONORRHOEAE: NOT DETECTED

## 2015-04-17 LAB — CBC
HCT: 28.8 % — ABNORMAL LOW (ref 35.0–47.0)
Hemoglobin: 9.2 g/dL — ABNORMAL LOW (ref 12.0–16.0)
MCH: 24.4 pg — AB (ref 26.0–34.0)
MCHC: 31.8 g/dL — AB (ref 32.0–36.0)
MCV: 76.6 fL — AB (ref 80.0–100.0)
PLATELETS: 241 10*3/uL (ref 150–440)
RBC: 3.76 MIL/uL — ABNORMAL LOW (ref 3.80–5.20)
RDW: 15.1 % — AB (ref 11.5–14.5)
WBC: 15 10*3/uL — ABNORMAL HIGH (ref 3.6–11.0)

## 2015-04-17 LAB — RPR: RPR Ser Ql: NONREACTIVE

## 2015-04-17 LAB — HEPATITIS C ANTIBODY: HCV Ab: <0.1 s/co ratio (ref 0.0–0.9)

## 2015-04-17 NOTE — Progress Notes (Signed)
Post Partum Day 1 Subjective: no complaints  Objective: Blood pressure 100/70, pulse 96, temperature 98.2 F (36.8 C), temperature source Oral, resp. rate 18, height 5\' 1"  (1.549 m), weight 147 lb (66.679 kg), SpO2 100 %, unknown if currently breastfeeding.  Physical Exam:  General: alert and cooperative  Lungs cta  cv rrr Lochia: appropriate Uterine Fundus: firm Incision:n/a DVT Evaluation: No evidence of DVT seen on physical exam.   Recent Labs  04/16/15 0850 04/17/15 0400  HGB 9.6* 9.2*  HCT 31.0* 28.8*    Assessment/Plan: Plan for discharge tomorrow  Baby with some difficulty feeding , will delay discharge until tomorrow   LOS: 1 day   Lamarr Feenstra 04/17/2015, 10:56 AM
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
## 2015-04-18 MED ORDER — MEDROXYPROGESTERONE ACETATE 150 MG/ML IM SUSP: 150.0000 mg | Freq: Once | INTRAMUSCULAR | Status: DC

## 2015-04-18 MED ORDER — IBUPROFEN 600 MG PO TABS: 600.0000 mg | ORAL_TABLET | Freq: Four times a day (QID) | ORAL | Status: DC

## 2015-04-18 NOTE — Progress Notes (Signed)
Patient discharged home with infant. Discharge instructions, prescriptions and follow up appointment given to and reviewed with patient. Patient verbalized understanding. Escorted out with infant via wheelchair by auxillary.

## 2015-04-18 NOTE — Care Management (Signed)
Received referral for patient wanting custody of her other 2 children. This referral is being addressed per Clinical Social Worker, York SpanielMonica Marra. Gwenette GreetBrenda S Lillianna Sabel RN MSN Care Management [phone]

## 2015-04-18 NOTE — Discharge Summary (Signed)
Obstetric Discharge Summary Reason for Admission: onset of labor Prenatal Procedures: none Intrapartum Procedures: spontaneous vaginal delivery Postpartum Procedures: none Complications-Operative and Postpartum: none HEMOGLOBIN  Date Value Ref Range Status  04/17/2015 9.2* 12.0 - 16.0 g/dL Final   HGB  Date Value Ref Range Status  04/17/2013 10.6* 12.0-16.0 g/dL Final   HCT  Date Value Ref Range Status  04/17/2015 28.8* 35.0 - 47.0 % Final  04/18/2013 26.9* 35.0-47.0 % Final    Physical Exam:  General: alert and cooperative  Lungs CTA  CV rrr Lochia: appropriate Uterine Fundus: firm Incision: n/a  DVT Evaluation: No evidence of DVT seen on physical exam.  Discharge Diagnoses: Term Pregnancy-delivered  Discharge Information: Date: 04/18/2015 Activity: pelvic rest Diet: routine Medications: Ibuprofen Condition: stable Instructions: refer to practice specific booklet Discharge to: home Follow-up Information    Follow up with Ala DachJohanna K Halfon, MD In 6 weeks.   Specialty:  Obstetrics and Gynecology   Why:  postpartum care   Contact information:   [address] [place] [address] [phone]       Newborn Data: Live born female  Birth Weight: 5 lb 12.4 oz (2620 g) APGAR: 8, 9  Home with mother.  SCHERMERHORN,THOMAS 04/18/2015, 11:05 AM

## 2015-08-07 ENCOUNTER — Encounter: Payer: Self-pay | Admitting: Emergency Medicine

## 2015-08-07 ENCOUNTER — Emergency Department
Admission: EM | Admit: 2015-08-07 | Discharge: 2015-08-07 | Disposition: A | Discharge status: Home / Self Care | Payer: Medicaid Other | Attending: Emergency Medicine | Admitting: Emergency Medicine

## 2015-08-07 DIAGNOSIS — Z7952 Long term (current) use of systemic steroids: Secondary | ICD-10-CM | POA: Insufficient documentation

## 2015-08-07 DIAGNOSIS — N938 Other specified abnormal uterine and vaginal bleeding: Secondary | ICD-10-CM

## 2015-08-07 DIAGNOSIS — Z79899 Other long term (current) drug therapy: Secondary | ICD-10-CM | POA: Insufficient documentation

## 2015-08-07 NOTE — Discharge Instructions (Signed)

## 2015-08-07 NOTE — ED Provider Notes (Signed)
CSN: [id number]     Arrival date & time 08/07/15  1700 History   First MD Initiated Contact with Patient 08/07/15 1804     Chief Complaint  Patient presents with  . Vaginal Bleeding     HPI 30 year old female who presents to the emergency department for vaginal bleeding over the past 3 weeks. She states that this is her first postpartum menstrual cycle. Her daughter was born 3 months ago. She denies abdominal pain or urinary symptoms. She states that after her daughter was born, she completely stopped bleeding until 3 weeks ago. She states that the blood is dark in color and seems to be slowing down "a little bit." She states that she has used 3 regular size pads today. Past Medical History  Diagnosis Date  . Medical history non-contributory    History reviewed. No pertinent past surgical history. Family History  Problem Relation Age of Onset  . Arthritis Maternal Aunt   . Diabetes Maternal Aunt    Social History  Substance Use Topics  . Smoking status: Never Smoker   . Smokeless tobacco: None  . Alcohol Use: No   OB History    Gravida Para Term Preterm AB TAB SAB Ectopic Multiple Living   0 1     Review of Systems  Constitutional: Negative.  Negative for fever and chills.  Cardiovascular: Negative for chest pain and palpitations.  Gastrointestinal: Negative for nausea, vomiting, abdominal pain and blood in stool.  Genitourinary: Positive for vaginal bleeding. Negative for hematuria, decreased urine volume, vaginal discharge and vaginal pain.  Skin: Negative.   Neurological: Negative for dizziness, weakness and light-headedness.      Allergies  Review of patient's allergies indicates no known allergies.  Home Medications   Prior to Admission medications   Medication Sig Start Date End Date Taking? Authorizing Provider  ibuprofen (ADVIL,MOTRIN) 600 MG tablet Take 1 tablet (600 mg total) by mouth every 6 (six) hours. 04/18/15   Ihor Austin Schermerhorn, MD   medroxyPROGESTERone (DEPO-PROVERA) 150 MG/ML injection Inject 1 mL (150 mg total) into the muscle once. 04/18/15   Ihor Austin Schermerhorn, MD  Prenatal Vit-Fe Fumarate-FA (MULTIVITAMIN-PRENATAL) 27-0.8 MG TABS tablet Take 1 tablet by mouth daily at 12 noon.    Historical Provider, MD   BP 123/74 mmHg  Pulse 98  Temp(Src) 98.6 F (37 C) (Oral)  Resp 18  Ht  (1.549 m)  Wt 63.957 kg  BMI 26.66 kg/m2  SpO2 97%  LMP 07/17/2015 Physical Exam  Constitutional: She is oriented to person, place, and time. She appears well-developed and well-nourished.  HENT:  Head: Normocephalic.  Neck: Normal range of motion.  Cardiovascular: Normal rate and regular rhythm.   Pulmonary/Chest: Effort normal and breath sounds normal.  Abdominal: Soft. She exhibits no distension. There is no tenderness. There is no rebound and no guarding.  Musculoskeletal: Normal range of motion.  Neurological: She is alert and oriented to person, place, and time.  Skin: Skin is warm and dry. No pallor.  Psychiatric: Her behavior is normal. Thought content normal.  Nursing note and vitals reviewed.   ED Course  Procedures (including critical care time) Labs Review Labs Reviewed - No data to display  Imaging Review No results found. I have personally reviewed and evaluated these images and lab results as part of my medical decision-making.   EKG Interpretation None      MDM   Final diagnoses:  Dysfunctional uterine bleeding  Patient reports that she had the Depo shot just after her daughter was born. She continues to deny abdominal pain or vaginal discharge. Additionally, she denies passing large clots or having any signs or history of anemia. Patient was encouraged to schedule a follow-up appointment with her gynecologist if the bleeding does not continue to slow down and stop. Return precautions were discussed including abdominal pain, increase in bleeding, passing large clots, or dizziness. Patient was  agreeable to the plan and a vaginal exam was deferred.  Chinita Pesterari B Gunnar Hereford, FNP 08/07/15 2223  Chinita Pesterari B Fedor Kazmierski, FNP 08/07/15 2224  Jeanmarie PlantJames A McShane, MD 08/07/15 2258

## 2015-08-07 NOTE — ED Notes (Signed)
Discussed discharge instructions and follow-up care with patient. No questions or concerns at this time. Pt stable at discharge.  

## 2015-08-07 NOTE — ED Notes (Addendum)
Pt has had vaginal bleeding for 3 weeks. First period since baby that is 953 months old.  Has used 3 pads today.  Denies abdominal pain.  Denies urinary sx. Has not called obgyn

## 2016-04-28 ENCOUNTER — Encounter: Payer: Self-pay | Admitting: Emergency Medicine

## 2016-04-28 ENCOUNTER — Emergency Department: Payer: Self-pay

## 2016-04-28 ENCOUNTER — Emergency Department
Admission: EM | Admit: 2016-04-28 | Discharge: 2016-04-28 | Disposition: A | Discharge status: Home / Self Care | Payer: Self-pay | Attending: Student in an Organized Health Care Education/Training Program | Admitting: Student in an Organized Health Care Education/Training Program

## 2016-04-28 DIAGNOSIS — N939 Abnormal uterine and vaginal bleeding, unspecified: Secondary | ICD-10-CM

## 2016-04-28 DIAGNOSIS — Z791 Long term (current) use of non-steroidal anti-inflammatories (NSAID): Secondary | ICD-10-CM | POA: Insufficient documentation

## 2016-04-28 DIAGNOSIS — N3001 Acute cystitis with hematuria: Secondary | ICD-10-CM | POA: Insufficient documentation

## 2016-04-28 LAB — CBC WITH DIFFERENTIAL/PLATELET
BASOS ABS: 0.1 10*3/uL (ref 0–0.1)
Basophils Relative: 1 %
Eosinophils Absolute: 0.3 10*3/uL (ref 0–0.7)
Eosinophils Relative: 3 %
HEMATOCRIT: 39.2 % (ref 35.0–47.0)
Hemoglobin: 12.6 g/dL (ref 12.0–16.0)
LYMPHS ABS: 1.3 10*3/uL (ref 1.0–3.6)
LYMPHS PCT: 16 %
MCH: 26.1 pg (ref 26.0–34.0)
MCHC: 32 g/dL (ref 32.0–36.0)
MCV: 81.4 fL (ref 80.0–100.0)
MONO ABS: 0.5 10*3/uL (ref 0.2–0.9)
Monocytes Relative: 7 %
NEUTROS ABS: 5.7 10*3/uL (ref 1.4–6.5)
Neutrophils Relative %: 73 %
Platelets: 269 10*3/uL (ref 150–440)
RBC: 4.82 MIL/uL (ref 3.80–5.20)
RDW: 15.2 % — AB (ref 11.5–14.5)
WBC: 7.9 10*3/uL (ref 3.6–11.0)

## 2016-04-28 LAB — WET PREP, GENITAL
Sperm: NONE SEEN
Trich, Wet Prep: NONE SEEN
YEAST WET PREP: NONE SEEN

## 2016-04-28 LAB — URINALYSIS, COMPLETE (UACMP) WITH MICROSCOPIC
BILIRUBIN URINE: NEGATIVE
GLUCOSE, UA: NEGATIVE mg/dL
KETONES UR: NEGATIVE mg/dL
NITRITE: POSITIVE — AB
PROTEIN: 100 mg/dL — AB
Specific Gravity, Urine: 1.018 (ref 1.005–1.030)
pH: 5 (ref 5.0–8.0)

## 2016-04-28 LAB — CHLAMYDIA/NGC RT PCR (ARMC ONLY)
Chlamydia Tr: NOT DETECTED
N GONORRHOEAE: NOT DETECTED

## 2016-04-28 LAB — PROTIME-INR
INR: 1.1
Prothrombin Time: 14.2 seconds (ref 11.4–15.2)

## 2016-04-28 LAB — POCT PREGNANCY, URINE: PREG TEST UR: NEGATIVE

## 2016-04-28 LAB — BASIC METABOLIC PANEL
ANION GAP: 7 (ref 5–15)
BUN: 10 mg/dL (ref 6–20)
CO2: 24 mmol/L (ref 22–32)
Calcium: 8.8 mg/dL — ABNORMAL LOW (ref 8.9–10.3)
Chloride: 108 mmol/L (ref 101–111)
Creatinine, Ser: 0.9 mg/dL (ref 0.44–1.00)
GFR calc Af Amer: >60 mL/min (ref >60)
GFR calc non Af Amer: >60 mL/min (ref >60)
GLUCOSE: 93 mg/dL (ref 65–99)
POTASSIUM: 3.7 mmol/L (ref 3.5–5.1)
Sodium: 139 mmol/L (ref 135–145)

## 2016-04-28 MED ORDER — METRONIDAZOLE 250 MG PO TABS: 250.0000 mg | ORAL_TABLET | Freq: Two times a day (BID) | ORAL | 0 refills | Status: AC

## 2016-04-28 MED ORDER — METRONIDAZOLE 500 MG PO TABS: 2000.0000 mg | ORAL_TABLET | Freq: Once | ORAL | Status: DC

## 2016-04-28 MED ORDER — SODIUM CHLORIDE 0.9 % IV BOLUS (SEPSIS)
1000.0000 mL | Freq: Once | INTRAVENOUS | Status: AC
  Administered 2016-04-28: 1000 mL via INTRAVENOUS

## 2016-04-28 MED ORDER — CEPHALEXIN 500 MG PO CAPS: 500.0000 mg | ORAL_CAPSULE | Freq: Three times a day (TID) | ORAL | 0 refills | Status: AC

## 2016-04-28 NOTE — ED Triage Notes (Signed)
BIB EMS from work for vaginal bleeding that started today. Pt c/o of lower back pain and dizziness that started Thursday. Unsure if pregnant.Denies nausea, vomiting or diarrhea.

## 2016-04-28 NOTE — ED Notes (Signed)
Pt given another pad and disposable underwear.

## 2016-04-28 NOTE — ED Provider Notes (Signed)
[facility] Emergency Department Provider Note    First MD Initiated Contact with Patient 04/28/16 1323     (approximate)  I have reviewed the triage vital signs and the nursing notes.   HISTORY  Chief Complaint Vaginal Bleeding and Back Pain    HPI Weston BrassJasmine Mroz is a 30 y.o. female presents with 1 day of low back pain as well as vaginal bleeding that started while at work. Patient says her last cycle was one year ago. States that she may be pregnant. States that she has actually active. No fevers. No nausea or vomiting. Denies any dysuria. No constipation. States that the low back pain is similar to last time she is pregnant.   Past Medical History:  Diagnosis Date  . Medical history non-contributory    Family History  Problem Relation Age of Onset  . Arthritis Maternal Aunt   . Diabetes Maternal Aunt    History reviewed. No pertinent surgical history. Patient Active Problem List   Diagnosis Date Noted  . Normal labor and delivery 04/16/2015  . Premature uterine contractions 04/13/2015  . Irregular uterine contractions 04/13/2015  . Preterm contractions 03/25/2015  . Pelvic pain complicating pregnancy 02/23/2015      Prior to Admission medications   Medication Sig Start Date End Date Taking? Authorizing Provider  cephALEXin (KEFLEX) 500 MG capsule Take 1 capsule (500 mg total) by mouth 3 (three) times daily. 04/28/16 05/08/16  Willy EddyPatrick Machelle Raybon, MD  ibuprofen (ADVIL,MOTRIN) 600 MG tablet Take 1 tablet (600 mg total) by mouth every 6 (six) hours. 04/18/15   Ihor Austinhomas J Schermerhorn, MD  medroxyPROGESTERone (DEPO-PROVERA) 150 MG/ML injection Inject 1 mL (150 mg total) into the muscle once. 04/18/15   Ihor Austinhomas J Schermerhorn, MD  metroNIDAZOLE (FLAGYL) 250 MG tablet Take 1 tablet (250 mg total) by mouth 2 (two) times daily. 04/28/16 05/03/16  Willy EddyPatrick Kuron Docken, MD  Prenatal Vit-Fe Fumarate-FA (MULTIVITAMIN-PRENATAL) 27-0.8 MG TABS tablet Take 1 tablet by  mouth daily at 12 noon.    Historical Provider, MD    Allergies Patient has no known allergies.    Social History Social History  Substance Use Topics  . Smoking status: Never Smoker  . Smokeless tobacco: Not on file  . Alcohol use No    Review of Systems Patient denies headaches, rhinorrhea, blurry vision, numbness, shortness of breath, chest pain, edema, cough, abdominal pain, nausea, vomiting, diarrhea, dysuria, fevers, rashes or hallucinations unless otherwise stated above in HPI. ____________________________________________   PHYSICAL EXAM:  VITAL SIGNS: Vitals:   04/28/16 1700 04/28/16 1800  BP: 138/88 116/82  Pulse: 97 94  Resp: 18 16  Temp:      Constitutional: Alert and oriented. Well appearing and in no acute distress. Eyes: Conjunctivae are normal. PERRL. EOMI. Head: Atraumatic. Nose: No congestion/rhinnorhea. Mouth/Throat: Mucous membranes are moist.  Oropharynx non-erythematous. Neck: No stridor. Painless ROM. No cervical spine tenderness to palpation Hematological/Lymphatic/Immunilogical: No cervical lymphadenopathy. Cardiovascular: Normal rate, regular rhythm. Grossly normal heart sounds.  Good peripheral circulation. Respiratory: Normal respiratory effort.  No retractions. Lungs CTAB. Gastrointestinal: Soft and nontender. Appears gravid,  + distention. No abdominal bruits. No CVA tenderness. Genitourinary:normal appearing cervix, scant blood in vaginal vault Musculoskeletal: No lower extremity tenderness nor edema.  No joint effusions. Neurologic:  Normal speech and language. No gross focal neurologic deficits are appreciated. No gait instability. Skin:  Skin is warm, dry and intact. No rash noted. Psychiatric: Mood and affect are normal. Speech and behavior are normal.  ____________________________________________   LABS (  all labs ordered are listed, but only abnormal results are displayed)  Results for orders placed or performed during the  hospital encounter of 04/28/16 (from the past 24 hour(s))  Urinalysis, Complete w Microscopic     Status: Abnormal   Collection Time: 04/28/16  1:39 PM  Result Value Ref Range   Color, Urine YELLOW (A) YELLOW   APPearance HAZY (A) CLEAR   Specific Gravity, Urine 1.018 1.005 - 1.030   pH 5.0 5.0 - 8.0   Glucose, UA NEGATIVE NEGATIVE mg/dL   Hgb urine dipstick LARGE (A) NEGATIVE   Bilirubin Urine NEGATIVE NEGATIVE   Ketones, ur NEGATIVE NEGATIVE mg/dL   Protein, ur 355 (A) NEGATIVE mg/dL   Nitrite POSITIVE (A) NEGATIVE   Leukocytes, UA TRACE (A) NEGATIVE   RBC / HPF TOO NUMEROUS TO COUNT 0 - 5 RBC/hpf   WBC, UA TOO NUMEROUS TO COUNT 0 - 5 WBC/hpf   Bacteria, UA RARE (A) NONE SEEN   Squamous Epithelial / LPF 0-5 (A) NONE SEEN  CBC with Differential/Platelet     Status: Abnormal   Collection Time: 04/28/16  1:42 PM  Result Value Ref Range   WBC 7.9 3.6 - 11.0 K/uL   RBC 4.82 3.80 - 5.20 MIL/uL   Hemoglobin 12.6 12.0 - 16.0 g/dL   HCT 73.2 20.2 - 54.2 %   MCV 81.4 80.0 - 100.0 fL   MCH 26.1 26.0 - 34.0 pg   MCHC 32.0 32.0 - 36.0 g/dL   RDW 70.6 (H) 23.7 - 62.8 %   Platelets 269 150 - 440 K/uL   Neutrophils Relative % 73 %   Neutro Abs 5.7 1.4 - 6.5 K/uL   Lymphocytes Relative 16 %   Lymphs Abs 1.3 1.0 - 3.6 K/uL   Monocytes Relative 7 %   Monocytes Absolute 0.5 0.2 - 0.9 K/uL   Eosinophils Relative 3 %   Eosinophils Absolute 0.3 0 - 0.7 K/uL   Basophils Relative 1 %   Basophils Absolute 0.1 0 - 0.1 K/uL  Basic metabolic panel     Status: Abnormal   Collection Time: 04/28/16  1:42 PM  Result Value Ref Range   Sodium 139 135 - 145 mmol/L   Potassium 3.7 3.5 - 5.1 mmol/L   Chloride 108 101 - 111 mmol/L   CO2 24 22 - 32 mmol/L   Glucose, Bld 93 65 - 99 mg/dL   BUN 10 6 - 20 mg/dL   Creatinine, Ser 3.15 0.44 - 1.00 mg/dL   Calcium 8.8 (L) 8.9 - 10.3 mg/dL   GFR calc non Af Amer >60 >60 mL/min   GFR calc Af Amer >60 >60 mL/min   Anion gap 7 5 - 15  Protime-INR     Status:  None   Collection Time: 04/28/16  1:42 PM  Result Value Ref Range   Prothrombin Time 14.2 11.4 - 15.2 seconds   INR 1.10   Pregnancy, urine POC     Status: None   Collection Time: 04/28/16  1:47 PM  Result Value Ref Range   Preg Test, Ur NEGATIVE NEGATIVE  Chlamydia/NGC rt PCR ([facility] only)     Status: None   Collection Time: 04/28/16  6:26 PM  Result Value Ref Range   Specimen source GC/Chlam URINE, RANDOM    Chlamydia Tr NOT DETECTED NOT DETECTED   N gonorrhoeae NOT DETECTED NOT DETECTED  Wet prep, genital     Status: Abnormal   Collection Time: 04/28/16  6:26 PM  Result Value  Ref Range   Yeast Wet Prep HPF POC NONE SEEN NONE SEEN   Trich, Wet Prep NONE SEEN NONE SEEN   Clue Cells Wet Prep HPF POC PRESENT (A) NONE SEEN   WBC, Wet Prep HPF POC RARE (A) NONE SEEN   Sperm NONE SEEN    ____________________________________________  EKG____________________________________________  RADIOLOGY  I personally reviewed all radiographic images ordered to evaluate for the above acute complaints and reviewed radiology reports and findings.  These findings were personally discussed with the patient.  Please see medical record for radiology report. ____________________________________________   PROCEDURES  Procedure(s) performed:  Procedures    Critical Care performed: no ____________________________________________   INITIAL IMPRESSION / ASSESSMENT AND PLAN / ED COURSE  Pertinent labs & imaging results that were available during my care of the patient were reviewed by me and considered in my medical decision making (see chart for details).  DDX: ectopic, dub, endometriosis, obstruction, cyst, pregnancy, miscarriage  Weston BrassJasmine Balla is a 30 y.o. who presents to the ED with acute vaginal bleeding and lower back pain. Patient arrives afebrile and hemodynamic stable and otherwise well-appearing but does have blood soaking through her pants and appears gravid on initial exam. Patient does  have some form of nodule disability therefore history is limited and patient does not know whether she is pregnant or not.  Bedside ultrasound performed shows no evidence of intrauterine pregnancy but uterus does appear enlarged fluid filled. We'll check pregnancy due to concern for ectopic.  The patient will be placed on continuous pulse oximetry and telemetry for monitoring.  Laboratory evaluation will be sent to evaluate for the above complaints.     Clinical Course as of Apr 29 2027  Sat Apr 28, 2016  1830 Blood work is reassuring. Patient without evidence of pregnancy. No evidence of active hemorrhage. I do suspect the patient is having heavy menses and she has not had a Mitchell cycle in several months. Will treat for UTI. No evidence of sepsis. No evidence of PID. Patient will follow up with health department. Discussed signs and symptoms for which she should return to the ER.  [PR]    Clinical Course User Index [PR] Willy EddyPatrick Matty Vanroekel, MD     ____________________________________________   FINAL CLINICAL IMPRESSION(S) / ED DIAGNOSES  Final diagnoses:  Vaginal bleeding  Acute cystitis with hematuria      NEW MEDICATIONS STARTED DURING THIS VISIT:  Discharge Medication List as of 04/28/2016  6:32 PM    START taking these medications   Details  cephALEXin (KEFLEX) 500 MG capsule Take 1 capsule (500 mg total) by mouth 3 (three) times daily., Starting Sat 04/28/2016, Until Tue 05/08/2016, Print         Note:  This document was prepared using Dragon voice recognition software and may include unintentional dictation errors.    Willy EddyPatrick Vue Pavon, MD 04/28/16 2029

## 2017-05-07 NOTE — L&D Delivery Note (Signed)
  Delivery Note AROM for clear fluid at 0630. Pt progressed to 10/100/+1 with urge to push. FHT's 130's, mod variability, + acceleration. Prolong decel to 95bpm while patient pushing. FHT 128 bpm prior to delivery. SVD of viable baby boy. Vertex, anterior shoulder, posterior shoulder followed by body delivered over intact perineum at 0713. Loose nuchal cord x 1, non-reduced, infant delivered through it, to maternal right thigh then placed on maternal abdomen.  Infant dried and stimulated, spontaneous cry and good tone.  Cord clamped x 2 and cut by FOB after delayed cord clamping. Spontaneous delivery of intact placenta, schultz side at 0729. Maginal cord insertion with a 3VC.  Brisk bleeding with several clots noted. Fundus firm, U-2.  Oxytocin IVF bolus given per postpartum protocol.  Vaginal bleeding declined, fundus remained firm. Hemostasis achieved. VSS. Sponge and needle count correct.  EBL 300 ml. Infant skin to skin with maternal abd.   APGAR: 9, 9; weight  .   Placenta status: delivered, intact, discarded   Anesthesia:  None Episiotomy: None Lacerations:  None  Suture Repair: N/A Est. Blood Loss (mL):  300ml   Mom to postpartum.  Baby to Couplet care / Skin to Skin.  _________________________________________________  Myrtie Cruisearon W. Jones,RN, MSN, CNM, FNP Certified Nurse Midwife [facility]

## 2017-06-21 ENCOUNTER — Other Ambulatory Visit: Payer: Self-pay | Admitting: Advanced Practice Midwife

## 2017-06-21 DIAGNOSIS — Z3481 Encounter for supervision of other normal pregnancy, first trimester: Secondary | ICD-10-CM

## 2017-06-21 DIAGNOSIS — Z91410 Personal history of adult physical and sexual abuse: Secondary | ICD-10-CM

## 2017-06-21 HISTORY — DX: Personal history of adult physical and sexual abuse: Z91.410

## 2017-06-21 LAB — HM PAP SMEAR: HM Pap smear: POSITIVE

## 2017-06-22 LAB — OB RESULTS CONSOLE HEPATITIS B SURFACE ANTIGEN: HEP B S AG: NEGATIVE

## 2017-06-22 LAB — OB RESULTS CONSOLE GC/CHLAMYDIA
CHLAMYDIA, DNA PROBE: NEGATIVE
Gonorrhea: NEGATIVE

## 2017-06-22 LAB — OB RESULTS CONSOLE RPR: RPR: NONREACTIVE

## 2017-06-22 LAB — OB RESULTS CONSOLE VARICELLA ZOSTER ANTIBODY, IGG: VARICELLA IGG: IMMUNE

## 2017-06-22 LAB — OB RESULTS CONSOLE RUBELLA ANTIBODY, IGM: Rubella: IMMUNE

## 2017-06-24 ENCOUNTER — Other Ambulatory Visit: Payer: Self-pay | Admitting: Advanced Practice Midwife

## 2017-06-24 ENCOUNTER — Ambulatory Visit
Admission: RE | Admit: 2017-06-24 | Discharge: 2017-06-24 | Disposition: A | Discharge status: Home / Self Care | Payer: Medicaid Other | Source: Ambulatory Visit | Attending: Advanced Practice Midwife | Admitting: Advanced Practice Midwife

## 2017-06-24 DIAGNOSIS — Z3481 Encounter for supervision of other normal pregnancy, first trimester: Secondary | ICD-10-CM

## 2017-06-24 DIAGNOSIS — Z3A08 8 weeks gestation of pregnancy: Secondary | ICD-10-CM | POA: Diagnosis not present

## 2017-09-30 ENCOUNTER — Observation Stay
Admission: EM | Admit: 2017-09-30 | Discharge: 2017-09-30 | Disposition: A | Discharge status: Home / Self Care | Payer: Medicaid Other | Attending: Certified Nurse Midwife | Admitting: Certified Nurse Midwife

## 2017-09-30 ENCOUNTER — Other Ambulatory Visit: Payer: Self-pay

## 2017-09-30 DIAGNOSIS — R109 Unspecified abdominal pain: Secondary | ICD-10-CM | POA: Diagnosis present

## 2017-09-30 DIAGNOSIS — R1030 Lower abdominal pain, unspecified: Secondary | ICD-10-CM | POA: Insufficient documentation

## 2017-09-30 DIAGNOSIS — O9989 Other specified diseases and conditions complicating pregnancy, childbirth and the puerperium: Principal | ICD-10-CM | POA: Insufficient documentation

## 2017-09-30 DIAGNOSIS — Z3A22 22 weeks gestation of pregnancy: Secondary | ICD-10-CM | POA: Insufficient documentation

## 2017-09-30 LAB — URINALYSIS, ROUTINE W REFLEX MICROSCOPIC
BILIRUBIN URINE: NEGATIVE
Glucose, UA: NEGATIVE mg/dL
Hgb urine dipstick: NEGATIVE
Ketones, ur: NEGATIVE mg/dL
NITRITE: NEGATIVE
PH: 6 (ref 5.0–8.0)
Protein, ur: 30 mg/dL — AB
SPECIFIC GRAVITY, URINE: 1.023 (ref 1.005–1.030)

## 2017-09-30 MED ORDER — ACETAMINOPHEN 325 MG PO TABS: 650.0000 mg | ORAL_TABLET | ORAL | 0 refills | Status: DC | PRN

## 2017-09-30 MED ORDER — ACETAMINOPHEN 325 MG PO TABS: 650.0000 mg | ORAL_TABLET | ORAL | Status: DC | PRN

## 2017-09-30 NOTE — OB Triage Note (Signed)
Pt arrived in triage with c/o intermittent, sharp lower abdominal pain/pressure x 2 days. Reports good fetal movement. Denies leaking of fluid or vaginal bleeding. Fetal heart tones dopplered as charted. Toco applied. Abdomen non-tender to palpation, palpates soft. Discussed plan of care with patient. Patient verbalized understanding.

## 2017-09-30 NOTE — OB Triage Note (Signed)
Patient given discharge instructions and verbalized understanding. Discharged ambulatory in stable condition accompanied by significant other.

## 2017-09-30 NOTE — Discharge Instructions (Signed)
-  Be sure to drink plenty of water (60-90 ounces a day).  -It is easier to get overheated during pregnancy, so be careful with how much time you spend in the sun outside. When you are outside, make sure to drink lots of water! -You can take Tylenol as needed for pain.  -A pregnancy support band or belly band can be helpful for muscle discomforts of pregnancy. You can buy them in stores or online and there are different types.

## 2017-09-30 NOTE — Discharge Summary (Signed)
Toni Black is a 32 y.o. female. She is at [redacted]w[redacted]d gestation. No LMP recorded. Patient is pregnant. Estimated Date of Delivery: 02/01/18  Prenatal care site: [facility]   Chief complaint: abdominal pain  Location: power abdomen Onset/timing: two days ago Duration: intermittent, every few hours Quality: sharp, pressure Severity: moderate Aggravating or alleviating conditions: none Associated signs/symptoms: none Context: Toni Black reports a central lower abdominal pain that started a couple of days ago and comes intermittently, every few hours. She has not noted anything that seems to make it better or worse. She reports that she has been spending more time outside, but feels she has been well hydrated. She reports no dysuria, urgency, or increased urinary frequency. She last moved her bowels yesterday morning and reports regular bowel movements.   S: Resting comfortably in bed.   She reports:  -active fetal movement -no leakage of fluid  -no vaginal bleeding -no contractions  Maternal Medical History:   Past Medical History:  Diagnosis Date  . Medical history non-contributory    Past Obstetric History: -One late preterm delivery. Per patient, baby was around three weeks early and did not spend any extra time in the hospital after birth.   History reviewed. No pertinent surgical history.  No Known Allergies  Prior to Admission medications   Medication Sig Start Date End Date Taking? Authorizing Provider  Prenatal Vit-Fe Fumarate-FA (MULTIVITAMIN-PRENATAL) 27-0.8 MG TABS tablet Take 1 tablet by mouth daily at 12 noon.   Yes [provider]  ibuprofen (ADVIL,MOTRIN) 600 MG tablet Take 1 tablet (600 mg total) by mouth every 6 (six) hours. Patient not taking: Reported on 09/30/2017 04/18/15   Schermerhorn, Ihor Austin, MD  medroxyPROGESTERone (DEPO-PROVERA) 150 MG/ML injection Inject 1 mL (150 mg total) into the muscle once. Patient not taking: Reported on 09/30/2017 04/18/15    Schermerhorn, Ihor Austin, MD     Social History: She  reports that she has never smoked. She has never used smokeless tobacco. She reports that she does not drink alcohol or use drugs.  Family History: family history includes Arthritis in her maternal aunt; Diabetes in her maternal aunt.   Review of Systems: A full review of systems was performed and negative except as noted in the HPI.    O:  BP 121/69 (BP Location: Left Arm)   Pulse (!) 108   Temp 98.2 F (36.8 C) (Oral)   Resp 18   Ht  (1.549 m)   Wt 69.4 kg (153 lb)   BMI 28.91 kg/m  Results for orders placed or performed during the hospital encounter of 09/30/17 (from the past 48 hour(s))  Urinalysis, Routine w reflex microscopic   Collection Time: 09/30/17  3:55 AM  Result Value Ref Range   Color, Urine YELLOW (A) YELLOW   APPearance CLOUDY (A) CLEAR   Specific Gravity, Urine 1.023 1.005 - 1.030   pH 6.0 5.0 - 8.0   Glucose, UA NEGATIVE NEGATIVE mg/dL   Hgb urine dipstick NEGATIVE NEGATIVE   Bilirubin Urine NEGATIVE NEGATIVE   Ketones, ur NEGATIVE NEGATIVE mg/dL   Protein, ur 30 (A) NEGATIVE mg/dL   Nitrite NEGATIVE NEGATIVE   Leukocytes, UA MODERATE (A) NEGATIVE   RBC / HPF 0-5 0 - 5 RBC/hpf   WBC, UA 21-50 0 - 5 WBC/hpf   Bacteria, UA RARE (A) NONE SEEN   Squamous Epithelial / LPF 21-50 0 - 5   Mucus PRESENT      Constitutional: NAD, AAOx3  HE/ENT: extraocular movements grossly intact, moist  mucous membranes CV: RRR PULM: nl respiratory effort, CTABL     Abd: gravid, non-tender, non-distended, soft      Ext: Non-tender, Nonedmeatous   Psych: mood appropriate, speech normal Pelvic deferred  FHTs: 145-150bpm by doppler Toco: no contractions   A/P: 32 y.o. [redacted]w[redacted]d here for antenatal surveillance for abdominal pain  Labor: not present  Fetal Wellbeing: normal range FHTs  UA with some contamination, UC sent. Plan to start ABX if culture grows anything significant.   Discussed adequate hydration,  caution in hot weather, Tylenol PRN, and pregnancy support band for abdominal pain.   D/c home stable, precautions reviewed, follow-up as scheduled.    Toni Black 09/30/2017 4:38 AM   ----- Toni Black, CNM Certified Nurse Midwife [facility], Department of OB/GYN [facility]

## 2017-10-01 LAB — URINE CULTURE

## 2017-10-26 ENCOUNTER — Inpatient Hospital Stay
Admission: EM | Admit: 2017-10-26 | Discharge: 2017-10-26 | Disposition: A | Discharge status: Home / Self Care | Payer: Medicaid Other | Attending: Obstetrics and Gynecology | Admitting: Obstetrics and Gynecology

## 2017-10-26 ENCOUNTER — Other Ambulatory Visit: Payer: Self-pay

## 2017-10-26 ENCOUNTER — Encounter: Payer: Self-pay | Admitting: *Deleted

## 2017-10-26 DIAGNOSIS — O26892 Other specified pregnancy related conditions, second trimester: Secondary | ICD-10-CM | POA: Diagnosis not present

## 2017-10-26 DIAGNOSIS — R109 Unspecified abdominal pain: Secondary | ICD-10-CM | POA: Diagnosis present

## 2017-10-26 DIAGNOSIS — R1030 Lower abdominal pain, unspecified: Secondary | ICD-10-CM

## 2017-10-26 DIAGNOSIS — Z3A26 26 weeks gestation of pregnancy: Secondary | ICD-10-CM | POA: Diagnosis not present

## 2017-10-26 LAB — URINALYSIS, COMPLETE (UACMP) WITH MICROSCOPIC
Bilirubin Urine: NEGATIVE
Glucose, UA: NEGATIVE mg/dL
Hgb urine dipstick: NEGATIVE
Ketones, ur: NEGATIVE mg/dL
Nitrite: NEGATIVE
Protein, ur: NEGATIVE mg/dL
Specific Gravity, Urine: 1.018 (ref 1.005–1.030)
pH: 6 (ref 5.0–8.0)

## 2017-10-26 NOTE — OB Triage Note (Signed)
Cramping starting today @ 1800. Denies LOF, vaginal bleeding. Reports good fetal movement. Elaina HoopsElks, Mat Stuard S

## 2017-10-26 NOTE — Progress Notes (Addendum)
Toni Black is a 32 y.o. female. She is at 9556w0d gestation. Pt had Unreliable LMP approx 03/10/17 and US done at 8 2/7 weeks with Estimated Date of Delivery: 02/01/18 TRIAGE VISIT ONLY  Prenatal care site:  [facility]   Chief complaint:Presents today due to lower abd pain x 15 mins total prior to coming here. Pt does not feel any pain at present.   Location: Lower abd pain that started 15 mins prior to coming to hospital  Onset/timing:15 mins PTA Duration:15 mins total  Quality: constant pain in lower abd and felt like "baby pushing" Severity: intense Aggravating or alleviating conditions:No LOF, NO VB or UC's noted by pt, +FM Associated signs/symptoms: No back pain, no nausea, no vomiting or gas Context: Sitting at [employer] when she felt the pain start and it resolved as she was getting to hospital  S: Resting comfortably. no CTX, no VB.no LOF,  Active fetal movement.  Maternal Medical History:  WJX:[id number]:Adjustment disorder/anxiety/depression Hx of pp hemorrhage PTD at 3536 4/7 weeks Abnormal ZHY:QMVHQ4/69/62Pap:ASCUS2/21/19 XBM:WUXLKPap:LGSIL   Past Surgical History:  Procedure Laterality Date  . NO PAST SURGERIES      No Known Allergies  Prior to Admission medications   Medication Sig Start Date End Date Taking? Authorizing Provider  acetaminophen (TYLENOL) 325 MG tablet Take 2 tablets (650 mg total) by mouth every 4 (four) hours as needed for mild pain or moderate pain. 09/30/17  Yes Genia DelHaviland, Margaret, CNM  Prenatal Vit-Fe Fumarate-FA (MULTIVITAMIN-PRENATAL) 27-0.8 MG TABS tablet Take 1 tablet by mouth daily at 12 noon.   Yes [provider]     Social History: She  reports that she has never smoked. She has never used smokeless tobacco. She reports that she does not drink alcohol or use drugs.  Family History: family history includes Arthritis in her maternal aunt; Diabetes in her maternal aunt. no history of gyn cancers  Review of Systems: A full review of systems was performed and negative  except as noted in the HPI.  +abd pain lower pelvis but, resolved by the time the pt arrived. No fever, no aching, no chills.No dysuria, no urgency or frequency   O:  Ht 5\' 2"  (1.575 m)   Wt 153 lb (69.4 kg)   BMI 27.98 kg/m  No results found for this or any previous visit (from the past 48 hour(s)).  Vitals:   10/26/17 1844 10/26/17 1928  BP: 113/62 123/70  Pulse: (!) 110 100  Resp:  18  Temp:  98.4 F (36.9 C)   Constitutional: NAD, AAOx3  HE/ENT: extraocular movements grossly intact, moist mucous membranes CV: RRR PULM: nl respiratory effort, CTABL     Abd: gravid, non-tender, non-distended, soft      Ext: Non-tender, Nonedmeatous   Psych: mood appropriate, speech normal Pelvic: cx was FT on ext os as expected with a multip and closed on the inside  NST: 10 x 10 accels age appropriate and difficulty keeping baby on monitor Baseline: 140 Variability: moderate Accelerations present x >2  10 x 10 Decelerations rare variable Time 20mins  LABS: URINE Results for Toni BrassLANE, Prim (MRN [id number]) as of 10/26/2017 20:14  Ref. Range 10/26/2017 19:17  Appearance Latest Ref Range: CLEAR  HAZY (A)  Bilirubin Urine Latest Ref Range: NEGATIVE  NEGATIVE  Color, Urine Latest Ref Range: YELLOW  YELLOW (A)  Glucose Latest Ref Range: NEGATIVE mg/dL NEGATIVE  Hgb urine dipstick Latest Ref Range: NEGATIVE  NEGATIVE  Ketones, ur Latest Ref Range: NEGATIVE mg/dL NEGATIVE  Leukocytes,  UA Latest Ref Range: NEGATIVE  TRACE (A)  Nitrite Latest Ref Range: NEGATIVE  NEGATIVE  pH Latest Ref Range: 5.0 - 8.0  6.0  Protein Latest Ref Range: NEGATIVE mg/dL NEGATIVE  Specific Gravity, Urine Latest Ref Range: 1.005 - 1.030  1.018    A/P: 32 y.o. [redacted]w[redacted]d here for antenatal surveillance for lower pelivc/abd pain that is now resolved and there are no indications of Labor S/S. +FM noted by pt  Labor: not present.   Fetal Wellbeing: Reassuring Cat 1 tracing.  Reactive NST for age   D/c home stable,  precautions reviewed, follow-up as scheduled.   ----- Toni Cruise, MSN, CNM, FNP Certified Nurse Midwife [facility] OB/[facility]N [facility]

## 2017-10-26 NOTE — OB Triage Note (Signed)
Patient denies any pain at this time. Reports good fetal movement. Discussed plan of care with patient to discharge home at this time. Patient to return to hospital for painful contractions, leaking of fluid, vaginal bleeding or decreased fetal movement. Patient to keep regularly scheduled appointment at health department. Patient verbalized understanding and agreed to plan at this time.

## 2017-10-28 LAB — URINE CULTURE

## 2017-11-06 LAB — OB RESULTS CONSOLE HIV ANTIBODY (ROUTINE TESTING): HIV: NONREACTIVE

## 2017-11-06 LAB — HM HIV SCREENING LAB: HM HIV Screening: NEGATIVE

## 2017-11-13 ENCOUNTER — Other Ambulatory Visit: Payer: Self-pay

## 2017-11-13 ENCOUNTER — Observation Stay
Admission: EM | Admit: 2017-11-13 | Discharge: 2017-11-13 | Disposition: A | Discharge status: Home / Self Care | Payer: Medicaid Other | Attending: Certified Nurse Midwife | Admitting: Certified Nurse Midwife

## 2017-11-13 DIAGNOSIS — O26893 Other specified pregnancy related conditions, third trimester: Secondary | ICD-10-CM | POA: Diagnosis present

## 2017-11-13 DIAGNOSIS — N898 Other specified noninflammatory disorders of vagina: Secondary | ICD-10-CM | POA: Diagnosis present

## 2017-11-13 DIAGNOSIS — Z3A28 28 weeks gestation of pregnancy: Secondary | ICD-10-CM | POA: Insufficient documentation

## 2017-11-13 DIAGNOSIS — Z79899 Other long term (current) drug therapy: Secondary | ICD-10-CM | POA: Diagnosis not present

## 2017-11-13 LAB — WET PREP, GENITAL
Clue Cells Wet Prep HPF POC: NONE SEEN
SPERM: NONE SEEN
Trich, Wet Prep: NONE SEEN
YEAST WET PREP: NONE SEEN

## 2017-11-13 NOTE — OB Triage Note (Addendum)
Pt came in via EMS with a complaint of leaking fluid. She says she was standing at the front door and a large amount of fluid leaking and it didn't smell.

## 2017-11-13 NOTE — Discharge Summary (Signed)
Toni BrassJasmine Black is a 32 y.o. female. She is at 947w4d gestation. No LMP recorded. Patient is pregnant. Estimated Date of Delivery: 02/01/18  Prenatal care site: [facility]   Chief complaint: leakage of fluid Location: vagina Onset/timing: a couple of hours ago at work Duration: intermittent Quality: small amount of clear fluid from the vagina Severity: mild Aggravating or alleviating conditions: feels more leakage when standing up Associated signs/symptoms: none Context: Toni Black reports that she was at work when she felt a small amount of clear fluid leaking from her vagina. She states that she has felt it a little bit more since then, moreso when standing up. She is not having any other symptoms.   S: Resting comfortably.   She reports:  -active fetal movement -no vaginal bleeding -no contractions  Maternal Medical History:   Past Medical History:  Diagnosis Date  . Medical history non-contributory     Past Surgical History:  Procedure Laterality Date  . NO PAST SURGERIES      No Known Allergies  Prior to Admission medications   Medication Sig Start Date End Date Taking? Authorizing Provider  acetaminophen (TYLENOL) 325 MG tablet Take 2 tablets (650 mg total) by mouth every 4 (four) hours as needed for mild pain or moderate pain. 09/30/17   Genia DelHaviland, Appollonia Klee, CNM  Prenatal Vit-Fe Fumarate-FA (MULTIVITAMIN-PRENATAL) 27-0.8 MG TABS tablet Take 1 tablet by mouth daily at 12 noon.    [provider]     Social History: She  reports that she has never smoked. She has never used smokeless tobacco. She reports that she does not drink alcohol or use drugs.  Family History: family history includes Arthritis in her maternal aunt; Diabetes in her maternal aunt.  Review of Systems: A full review of systems was performed and negative except as noted in the HPI.    O:  BP 134/69 (BP Location: Left Arm)   Pulse 99   Temp 98.5 F (36.9 C) (Oral)   Resp 18  No results found for  this or any previous visit (from the past 48 hour(s)).   Constitutional: NAD, AAOx3  HE/ENT: extraocular movements grossly intact, moist mucous membranes CV: RRR PULM: nl respiratory effort, CTABL     Abd: gravid, non-tender, non-distended, soft      Ext: Non-tender, Nonedmeatous   Psych: mood appropriate, speech normal Pelvic: pool negative, nitrazine negative, moderate amount of milky white discharge, cervix visually closed  NST:  Baseline: 140bpm Variability: moderate Accelerations: 10x10s present Decelerations: absent Time: 60mins   A/P: 32 y.o. 6947w4d here for antenatal surveillance for concern for leakage of fluid  Labor: not present  Fetal Wellbeing: Reactive NST, reassuring for GA  Wet prep pending for evaluation for BV.   D/c home stable, precautions reviewed, follow-up as scheduled.   Genia DelMargaret Kalissa Grays 11/13/2017 2:07 PM ----- Genia DelMargaret Saadia Dewitt, CNM Certified Nurse Midwife [facility], Department of OB/GYN [facility]

## 2017-12-08 ENCOUNTER — Other Ambulatory Visit: Payer: Self-pay

## 2017-12-08 ENCOUNTER — Observation Stay
Admission: EM | Admit: 2017-12-08 | Discharge: 2017-12-08 | Disposition: A | Discharge status: Home / Self Care | Payer: Medicaid Other | Attending: Obstetrics and Gynecology | Admitting: Obstetrics and Gynecology

## 2017-12-08 ENCOUNTER — Encounter: Payer: Self-pay | Admitting: Obstetrics and Gynecology

## 2017-12-08 DIAGNOSIS — O0990 Supervision of high risk pregnancy, unspecified, unspecified trimester: Secondary | ICD-10-CM

## 2017-12-08 DIAGNOSIS — Z3A33 33 weeks gestation of pregnancy: Secondary | ICD-10-CM | POA: Diagnosis not present

## 2017-12-08 DIAGNOSIS — O4703 False labor before 37 completed weeks of gestation, third trimester: Principal | ICD-10-CM | POA: Insufficient documentation

## 2017-12-08 DIAGNOSIS — Z3490 Encounter for supervision of normal pregnancy, unspecified, unspecified trimester: Secondary | ICD-10-CM

## 2017-12-08 LAB — URINALYSIS, ROUTINE W REFLEX MICROSCOPIC
BILIRUBIN URINE: NEGATIVE
GLUCOSE, UA: NEGATIVE mg/dL
KETONES UR: NEGATIVE mg/dL
NITRITE: NEGATIVE
Protein, ur: NEGATIVE mg/dL
Specific Gravity, Urine: 1.01 (ref 1.005–1.030)
pH: 6 (ref 5.0–8.0)

## 2017-12-08 NOTE — Discharge Instructions (Signed)
Please call or return [facility] with any of the following concerns: Vaginal Bleeding Leaking of Fluid Decreased Fetal Movement Contractions (every 3-5 mins for an hour)

## 2017-12-08 NOTE — OB Triage Note (Signed)
Pt presents to L&D with c/o 7/10 R lower back pain that started at 5:30pm this evening while pt was at work. Pt denies contractions, decreased fetal movement, leaking of fluid, or vaginal bleeding at this time. Toco and EFM applied and explained. All questions answered. Will to monitor closely.

## 2017-12-10 LAB — CULTURE, OB URINE

## 2017-12-16 NOTE — Discharge Summary (Signed)
contractions and LBP . Reactive nst . No active labor  D/C home

## 2017-12-18 ENCOUNTER — Observation Stay
Admission: EM | Admit: 2017-12-18 | Discharge: 2017-12-18 | Disposition: A | Discharge status: Home / Self Care | Payer: Medicaid Other | Attending: Obstetrics and Gynecology | Admitting: Obstetrics and Gynecology

## 2017-12-18 DIAGNOSIS — Z3493 Encounter for supervision of normal pregnancy, unspecified, third trimester: Secondary | ICD-10-CM | POA: Diagnosis present

## 2017-12-18 DIAGNOSIS — O368190 Decreased fetal movements, unspecified trimester, not applicable or unspecified: Secondary | ICD-10-CM | POA: Diagnosis present

## 2017-12-18 DIAGNOSIS — Z3A33 33 weeks gestation of pregnancy: Secondary | ICD-10-CM | POA: Diagnosis not present

## 2017-12-18 LAB — URINE DRUG SCREEN, QUALITATIVE (ARMC ONLY)
AMPHETAMINES, UR SCREEN: NOT DETECTED
Barbiturates, Ur Screen: NOT DETECTED
Cannabinoid 50 Ng, Ur ~~LOC~~: NOT DETECTED
Cocaine Metabolite,Ur ~~LOC~~: NOT DETECTED
MDMA (ECSTASY) UR SCREEN: NOT DETECTED
Methadone Scn, Ur: NOT DETECTED
Opiate, Ur Screen: NOT DETECTED
Phencyclidine (PCP) Ur S: NOT DETECTED
TRICYCLIC, UR SCREEN: NOT DETECTED

## 2017-12-18 LAB — URINALYSIS, ROUTINE W REFLEX MICROSCOPIC
Bilirubin Urine: NEGATIVE
Glucose, UA: NEGATIVE mg/dL
Hgb urine dipstick: NEGATIVE
KETONES UR: NEGATIVE mg/dL
Nitrite: NEGATIVE
PROTEIN: NEGATIVE mg/dL
Specific Gravity, Urine: 1.027 (ref 1.005–1.030)
pH: 6 (ref 5.0–8.0)

## 2017-12-18 LAB — CHLAMYDIA/NGC RT PCR (ARMC ONLY)
CHLAMYDIA TR: NOT DETECTED
N gonorrhoeae: NOT DETECTED

## 2017-12-18 NOTE — Discharge Summary (Signed)
Toni Black is a 32 y.o. female. She is at 3145w4d gestation. Patient's last menstrual period was 04/25/2017 (exact date). Estimated Date of Delivery: 02/01/18  Prenatal care site:  [facility]  Current pregnancy complicated by:  1. Multiple +urine cultures for mixed species and contaminants.  2. Anemia 3. Tinea corporis and versicolor 4. Hx PTB at 36+4wks, declines 17P 5. Hx PPH  Chief complaint: decreased fetal movement today, presented to [facility] at 1630 and they were unable to get adequate NST but did note fetal movement.   Onset/timing: decreased movements since about 1300 today.    S: Resting comfortably. no CTX, no VB.no LOF,  Active fetal movement. Denies: HA, visual changes, SOB, or RUQ/epigastric pain  Maternal Medical History:   Past Medical History:  Diagnosis Date  . Medical history non-contributory     Past Surgical History:  Procedure Laterality Date  . NO PAST SURGERIES      No Known Allergies  Prior to Admission medications   Medication Sig Start Date End Date Taking? Authorizing Provider  acetaminophen (TYLENOL) 325 MG tablet Take 2 tablets (650 mg total) by mouth every 4 (four) hours as needed for mild pain or moderate pain. Patient not taking: Reported on 12/08/2017 09/30/17   Genia DelHaviland, Margaret, CNM  Prenatal Vit-Fe Fumarate-FA (MULTIVITAMIN-PRENATAL) 27-0.8 MG TABS tablet Take 1 tablet by mouth daily at 12 noon.    [provider]      Social History: She  reports that she has never smoked. She has never used smokeless tobacco. She reports that she does not drink alcohol or use drugs.  Family History: family history includes Arthritis in her maternal aunt; Diabetes in her maternal aunt.   Review of Systems: A full review of systems was performed and negative except as noted in the HPI.     O:  BP 115/69 (BP Location: Left Arm)   Pulse (!) 108   Temp 98.2 F (36.8 C) (Oral)   Resp 17   Ht 5\' 2"  (1.575 m)   Wt 69.9 kg   LMP 04/25/2017 (Exact  Date)   BMI 28.17 kg/m  No results found for this or any previous visit (from the past 48 hour(s)).   Constitutional: NAD, AAOx3  HE/ENT: extraocular movements grossly intact, moist mucous membranes CV: RRR PULM: nl respiratory effort, CTABL     Abd: gravid, non-tender, non-distended, soft, active FM palpated.      Ext: Non-tender, Nonedematous   Psych: mood appropriate, speech normal Pelvic: deferred  Fetal  monitoring: Cat I Appropriate for GA; reactive NST Baseline: 140 bpm Variability: moderate Accelerations:  present x >2 Decelerations absent Time 20mins    A/P: 32 y.o. 3845w4d here for antenatal surveillance for decreased fetal movement  Fetal Wellbeing: Reassuring Cat 1 tracing.  Reactive NST   D/c home stable, precautions reviewed, follow-up as scheduled.    McVey, REBECCA A, CNM 12/18/2017  7:16 PM

## 2017-12-18 NOTE — Discharge Instructions (Signed)
Braxton Hicks Contractions °Contractions of the uterus can occur throughout pregnancy, but they are not always a sign that you are in labor. You may have practice contractions called Braxton Hicks contractions. These false labor contractions are sometimes confused with true labor. °What are Braxton Hicks contractions? °Braxton Hicks contractions are tightening movements that occur in the muscles of the uterus before labor. Unlike true labor contractions, these contractions do not result in opening (dilation) and thinning of the cervix. Toward the end of pregnancy (32-34 weeks), Braxton Hicks contractions can happen more often and may become stronger. These contractions are sometimes difficult to tell apart from true labor because they can be very uncomfortable. You should not feel embarrassed if you go to the hospital with false labor. °Sometimes, the only way to tell if you are in true labor is for your health care provider to look for changes in the cervix. The health care provider will do a physical exam and may monitor your contractions. If you are not in true labor, the exam should show that your cervix is not dilating and your water has not broken. °If there are other health problems associated with your pregnancy, it is completely safe for you to be sent home with false labor. You may continue to have Braxton Hicks contractions until you go into true labor. °How to tell the difference between true labor and false labor °True labor °· Contractions last 30-70 seconds. °· Contractions become very regular. °· Discomfort is usually felt in the top of the uterus, and it spreads to the lower abdomen and low back. °· Contractions do not go away with walking. °· Contractions usually become more intense and increase in frequency. °· The cervix dilates and gets thinner. °False labor °· Contractions are usually shorter and not as strong as true labor contractions. °· Contractions are usually irregular. °· Contractions  are often felt in the front of the lower abdomen and in the groin. °· Contractions may go away when you walk around or change positions while lying down. °· Contractions get weaker and are shorter-lasting as time goes on. °· The cervix usually does not dilate or become thin. °Follow these instructions at home: °· Take over-the-counter and prescription medicines only as told by your health care provider. °· Keep up with your usual exercises and follow other instructions from your health care provider. °· Eat and drink lightly if you think you are going into labor. °· If Braxton Hicks contractions are making you uncomfortable: °? Change your position from lying down or resting to walking, or change from walking to resting. °? Sit and rest in a tub of warm water. °? Drink enough fluid to keep your urine pale yellow. Dehydration may cause these contractions. °? Do slow and deep breathing several times an hour. °· Keep all follow-up prenatal visits as told by your health care provider. This is important. °Contact a health care provider if: °· You have a fever. °· You have continuous pain in your abdomen. °Get help right away if: °· Your contractions become stronger, more regular, and closer together. °· You have fluid leaking or gushing from your vagina. °· You pass blood-tinged mucus (bloody show). °· You have bleeding from your vagina. °· You have low back pain that you never had before. °· You feel your baby’s head pushing down and causing pelvic pressure. °· Your baby is not moving inside you as much as it used to. °Summary °· Contractions that occur before labor are called Braxton   Hicks contractions, false labor, or practice contractions. °· Braxton Hicks contractions are usually shorter, weaker, farther apart, and less regular than true labor contractions. True labor contractions usually become progressively stronger and regular and they become more frequent. °· Manage discomfort from Braxton Hicks contractions by  changing position, resting in a warm bath, drinking plenty of water, or practicing deep breathing. °This information is not intended to replace advice given to you by your health care provider. Make sure you discuss any questions you have with your health care provider. °Document Released: 09/06/2016 Document Revised: 09/06/2016 Document Reviewed: 09/06/2016 °Elsevier Interactive Patient Education © 2018 [employer] ° °Fetal Movement Counts °Patient Name: ________________________________________________ Patient Due Date: ____________________ °What is a fetal movement count? °A fetal movement count is the number of times that you feel your baby move during a certain amount of time. This may also be called a fetal kick count. A fetal movement count is recommended for every pregnant woman. You may be asked to start counting fetal movements as early as week 28 of your pregnancy. °Pay attention to when your baby is most active. You may notice your baby's sleep and wake cycles. You may also notice things that make your baby move more. You should do a fetal movement count: °· When your baby is normally most active. °· At the same time each day. ° °A good time to count movements is while you are resting, after having something to eat and drink. °How do I count fetal movements? °1. Find a quiet, comfortable area. Sit, or lie down on your side. °2. Write down the date, the start time and stop time, and the number of movements that you felt between those two times. Take this information with you to your health care visits. °3. For 2 hours, count kicks, flutters, swishes, rolls, and jabs. You should feel at least 10 movements during 2 hours. °4. You may stop counting after you have felt 10 movements. °5. If you do not feel 10 movements in 2 hours, have something to eat and drink. Then, keep resting and counting for 1 hour. If you feel at least 4 movements during that hour, you may stop counting. °Contact a health care  provider if: °· You feel fewer than 4 movements in 2 hours. °· Your baby is not moving like he or she usually does. °Date: ____________ Start time: ____________ Stop time: ____________ Movements: ____________ °Date: ____________ Start time: ____________ Stop time: ____________ Movements: ____________ °Date: ____________ Start time: ____________ Stop time: ____________ Movements: ____________ °Date: ____________ Start time: ____________ Stop time: ____________ Movements: ____________ °Date: ____________ Start time: ____________ Stop time: ____________ Movements: ____________ °Date: ____________ Start time: ____________ Stop time: ____________ Movements: ____________ °Date: ____________ Start time: ____________ Stop time: ____________ Movements: ____________ °Date: ____________ Start time: ____________ Stop time: ____________ Movements: ____________ °Date: ____________ Start time: ____________ Stop time: ____________ Movements: ____________ °This information is not intended to replace advice given to you by your health care provider. Make sure you discuss any questions you have with your health care provider. °Document Released: 05/23/2006 Document Revised: 12/21/2015 Document Reviewed: 06/02/2015 °Elsevier Interactive Patient Education © 2018 [employer] ° °

## 2017-12-18 NOTE — OB Triage Note (Signed)
Patient reported to L&D with complaints of decreased fetal movement since around 1300 today.  Denies vaginal bleeding or leaking fluid.

## 2017-12-20 LAB — CULTURE, OB URINE

## 2017-12-26 ENCOUNTER — Observation Stay
Admission: EM | Admit: 2017-12-26 | Discharge: 2017-12-26 | Disposition: A | Discharge status: Home / Self Care | Payer: Medicaid Other | Attending: Certified Nurse Midwife | Admitting: Certified Nurse Midwife

## 2017-12-26 ENCOUNTER — Other Ambulatory Visit: Payer: Self-pay

## 2017-12-26 DIAGNOSIS — O26893 Other specified pregnancy related conditions, third trimester: Principal | ICD-10-CM | POA: Insufficient documentation

## 2017-12-26 DIAGNOSIS — Z3A34 34 weeks gestation of pregnancy: Secondary | ICD-10-CM | POA: Diagnosis not present

## 2017-12-26 DIAGNOSIS — R109 Unspecified abdominal pain: Secondary | ICD-10-CM | POA: Diagnosis present

## 2017-12-26 LAB — URINALYSIS, COMPLETE (UACMP) WITH MICROSCOPIC
BILIRUBIN URINE: NEGATIVE
Glucose, UA: NEGATIVE mg/dL
Hgb urine dipstick: NEGATIVE
Ketones, ur: NEGATIVE mg/dL
Nitrite: NEGATIVE
PH: 6 (ref 5.0–8.0)
Protein, ur: NEGATIVE mg/dL
SPECIFIC GRAVITY, URINE: 1.024 (ref 1.005–1.030)

## 2017-12-26 MED ORDER — ACETAMINOPHEN 325 MG PO TABS
650.0000 mg | ORAL_TABLET | ORAL | Status: DC | PRN
  Administered 2017-12-26: 650 mg via ORAL
  Filled 2017-12-26: qty 2

## 2017-12-26 NOTE — OB Triage Note (Addendum)
Pt is a 32 y/o G5P4 at 6221w5d that presents from the ED with c/o contractions. Denies LOF or vaginal bleeding. Pt states positive fetal movement. Pt stated ctx began Tuesday off and on and stated they are intense in the lower abdomen and occasionally in the back. Pt rates the pain during a ctx as a 9/10. Monitors applied and assessing. Initial FHT 140.

## 2017-12-26 NOTE — Discharge Summary (Signed)
Toni Black is a 32 y.o. female. She is at [redacted]w[redacted]d gestation. Patient's last menstrual period was 04/25/2017 (exact date). Estimated Date of Delivery: 02/01/18  Prenatal care site: [facility]   Chief complaint: abdominal pain/contractions Location: lower abdomen, sometimes lower back Onset/timing: two days ago Duration: intermittent Quality: sharp, stabbing Severity: 5-9/10  Aggravating or alleviating conditions: none Associated signs/symptoms: none Context: Ruta reports intermittent lower abdominal pain for the past couple of days. She reports that it has felt like contractions. She was having the pain off and on all day on Tuesday.   S: Resting comfortably.   She reports:  -active fetal movement -no leakage of fluid -no vaginal bleeding   Maternal Medical History:   Past Medical History:  Diagnosis Date  . Medical history non-contributory     Past Surgical History:  Procedure Laterality Date  . NO PAST SURGERIES      No Known Allergies  Prior to Admission medications   Medication Sig Start Date End Date Taking? Authorizing Provider  Prenatal Vit-Fe Fumarate-FA (MULTIVITAMIN-PRENATAL) 27-0.8 MG TABS tablet Take 1 tablet by mouth daily at 12 noon.   Yes [provider]  acetaminophen (TYLENOL) 325 MG tablet Take 2 tablets (650 mg total) by mouth every 4 (four) hours as needed for mild pain or moderate pain. Patient not taking: Reported on 12/08/2017 09/30/17   [name], CNM     Social History: She  reports that she has never smoked. She has never used smokeless tobacco. She reports that she does not drink alcohol or use drugs.  Family History: family history includes Arthritis in her maternal aunt; Diabetes in her maternal aunt.   Review of Systems: A full review of systems was performed and negative except as noted in the HPI.    O:  BP 108/65 (BP Location: Right Arm)   Pulse (!) 116   Temp 98.4 F (36.9 C) (Oral)   Resp 18   Ht 5\' 1"  (1.549 m)    Wt 66.7 kg   LMP 04/25/2017 (Exact Date)   BMI 27.78 kg/m  Results for orders placed or performed during the hospital encounter of 12/26/17 (from the past 48 hour(s))  Urinalysis, Complete w Microscopic   Collection Time: 12/26/17  3:11 PM  Result Value Ref Range   Color, Urine YELLOW (A) YELLOW   APPearance HAZY (A) CLEAR   Specific Gravity, Urine 1.024 1.005 - 1.030   pH 6.0 5.0 - 8.0   Glucose, UA NEGATIVE NEGATIVE mg/dL   Hgb urine dipstick NEGATIVE NEGATIVE   Bilirubin Urine NEGATIVE NEGATIVE   Ketones, ur NEGATIVE NEGATIVE mg/dL   Protein, ur NEGATIVE NEGATIVE mg/dL   Nitrite NEGATIVE NEGATIVE   Leukocytes, UA MODERATE (A) NEGATIVE   RBC / HPF 0-5 0 - 5 RBC/hpf   WBC, UA 0-5 0 - 5 WBC/hpf   Bacteria, UA RARE (A) NONE SEEN   Squamous Epithelial / LPF 11-20 0 - 5   Mucus PRESENT      Constitutional: NAD, AAOx3  HE/ENT: extraocular movements grossly intact, moist mucous membranes CV: RRR PULM: normal respiratory effort, CTABL     Abd: gravid, non-tender, non-distended, soft      Ext: Non-tender, Nonedmeatous   Psych: mood appropriate, speech normal Pelvic: SVE 2cm/50%/-3 soft/posterior per RN exam at 1505  NST:  Baseline: 140bpm Variability: moderate Accelerations: 15x15s present x >2 Decelerations: absent Time: Toco: quiet   A/P: 32 y.o. [redacted]w[redacted]d here for antenatal surveillance for abdominal pain.   Labor  Not present  Fetal Wellbeing  Reactive NST, reassuring for GA  Abdominal pain  UA with moderate leukocytes, rare bacteria, and some squamous epithelial cells. Culture ordered as leukocytes may be due to skin contamination.   Advised hydration, Tylenol PRN, and abdominal support band for abdominal discomfort.   D/c home stable, precautions reviewed, follow-up as scheduled.   Patient reports routine prenatal visit scheduled on 12/31/2017 at [facility].     Genia DelMargaret Florentina Marquart 12/26/2017 3:53 PM  ----- Genia DelMargaret Falisa Lamora, CNM Certified Nurse  Midwife [facility], Department of OB/GYN [facility]

## 2017-12-28 LAB — URINE CULTURE: Culture: 40000 — AB

## 2018-01-09 ENCOUNTER — Emergency Department
Admission: EM | Admit: 2018-01-09 | Discharge: 2018-01-09 | Disposition: A | Discharge status: Home / Self Care | Payer: Medicaid Other | Attending: Emergency Medicine | Admitting: Emergency Medicine

## 2018-01-09 DIAGNOSIS — Z3A27 27 weeks gestation of pregnancy: Secondary | ICD-10-CM | POA: Diagnosis not present

## 2018-01-09 DIAGNOSIS — R6883 Chills (without fever): Secondary | ICD-10-CM | POA: Insufficient documentation

## 2018-01-09 DIAGNOSIS — Z5321 Procedure and treatment not carried out due to patient leaving prior to being seen by health care provider: Secondary | ICD-10-CM | POA: Diagnosis not present

## 2018-01-09 DIAGNOSIS — O9989 Other specified diseases and conditions complicating pregnancy, childbirth and the puerperium: Secondary | ICD-10-CM | POA: Diagnosis not present

## 2018-01-09 NOTE — ED Notes (Signed)
Pt reports that she was at work and they told her that she felt warm to the touch and for her to come to the ER. Pt reports that she feels fine and if she does not have a fever she wants to go home. Checked pts temp and it was 98.1 Pt decided to go home.

## 2018-01-09 NOTE — ED Notes (Signed)
Patient afebrile in triage. Patient refusing to be seen at this time. Patient denies any complaints at this time. Patient encouraged to stay for further evaluation, risks of leaving without further evaluation and treatment discussed. Patient refusing to stay, verbalized understanding of all risks of leaving without further evaluation and treatment. Patient left.

## 2018-01-15 ENCOUNTER — Observation Stay
Admission: EM | Admit: 2018-01-15 | Discharge: 2018-01-15 | Disposition: A | Discharge status: Home / Self Care | Payer: Medicaid Other | Attending: Obstetrics and Gynecology | Admitting: Obstetrics and Gynecology

## 2018-01-15 DIAGNOSIS — O471 False labor at or after 37 completed weeks of gestation: Principal | ICD-10-CM | POA: Insufficient documentation

## 2018-01-15 DIAGNOSIS — O479 False labor, unspecified: Secondary | ICD-10-CM | POA: Diagnosis present

## 2018-01-15 DIAGNOSIS — Z79899 Other long term (current) drug therapy: Secondary | ICD-10-CM | POA: Diagnosis not present

## 2018-01-15 DIAGNOSIS — Z8261 Family history of arthritis: Secondary | ICD-10-CM | POA: Diagnosis not present

## 2018-01-15 DIAGNOSIS — Z3A37 37 weeks gestation of pregnancy: Secondary | ICD-10-CM | POA: Diagnosis present

## 2018-01-15 DIAGNOSIS — Z833 Family history of diabetes mellitus: Secondary | ICD-10-CM | POA: Diagnosis not present

## 2018-01-15 NOTE — Progress Notes (Signed)
Patient ID: Toni Black, female   DOB: [birth year], 32 y.o.   MRN: [id number]   Toni Black is a 32 y.o. female. She is at [redacted]w[redacted]d gestation. Patient's last menstrual period was 04/25/2017 (exact date). Estimated Date of Delivery: 02/01/18  Prenatal care site:[facility] Chief complaint: Uterine ctx starting last pm . No LOF , some increased d/c     Maternal Medical History:   Past Medical History:  Diagnosis Date  . Medical history non-contributory     Past Surgical History:  Procedure Laterality Date  . NO PAST SURGERIES      No Known Allergies  Prior to Admission medications   Medication Sig Start Date End Date Taking? Authorizing Provider  acetaminophen (TYLENOL) 325 MG tablet Take 2 tablets (650 mg total) by mouth every 4 (four) hours as needed for mild pain or moderate pain. Patient not taking: Reported on 12/08/2017 09/30/17   [name], CNM  Prenatal Vit-Fe Fumarate-FA (MULTIVITAMIN-PRENATAL) 27-0.8 MG TABS tablet Take 1 tablet by mouth daily at 12 noon.    [provider]     Social History: She  reports that she has never smoked. She has never used smokeless tobacco. She reports that she does not drink alcohol or use drugs.  Family History: family history includes Arthritis in her maternal aunt; Diabetes in her maternal aunt.  no history of gyn cancers  Review of Systems: A full review of systems was performed and negative except as noted in the HPI.    Review of Systems: A full review of systems was performed and negative except as noted in the HPI.   Eyes: no vision change  Ears: left ear pain  Oropharynx: no sore throat  Pulmonary . No shortness of breath , no hemoptysis Cardiovascular: no chest pain , no irregular heart beat  Gastrointestinal:no blood in stool . No diarrhea, no constipation Uro gynecologic: no dysuria , no pelvic pain Neurologic : no seizure , no migraines    Musculoskeletal: no muscular weakness  O:  BP 117/73 (BP Location: Left  Arm)   Pulse 98   Temp 98.1 F (36.7 C) (Oral)   Resp 16   Ht 5\' 1"  (1.549 m)   Wt 67.6 kg   LMP 04/25/2017 (Exact Date)   BMI 28.15 kg/m  No results found for this or any previous visit (from the past 48 hour(s)).   Constitutional: NAD, AAOx3  HE/ENT: extraocular movements grossly intact, moist mucous membranes CV: RRR PULM: nl respiratory effort, CTABL     Abd: gravid, non-tender, non-distended, soft      Ext: Non-tender, Nonedmeatous   Psych: mood appropriate, speech normal Pelvic 3.5 cm / 50 / -3 , same after recheck 3 hrs later  NST: reactive  Baseline:  Variability: moderate Accelerations present x >2 Decelerations absent Time    A/P: 32 y.o. [redacted]w[redacted]d here for antenatal surveillance for contraction , no labor   Labor: not present.   Fetal Wellbeing: Reassuring Cat 1 tracing.  Reactive NST   D/c home stable, precautions reviewed, follow-up as scheduled.   ----- Jennell Corner , MD Attending Obstetrician and Gynecologist [facility], Department of OB/GYN [facility]

## 2018-01-15 NOTE — Discharge Summary (Signed)
Patient ID: Toni Black, female   DOB: [birth year], 32 y.o.   MRN: [id number]   Subjective   Toni Black is a 32 y.o. female. She is at [redacted]w[redacted]d gestation. Patient's last menstrual period was 04/25/2017 (exact date). Estimated Date of Delivery: 02/01/18  Prenatal care site:[facility] Chief complaint: Uterine ctx starting last pm . No LOF , some increased d/c     Maternal Medical History:       Past Medical History:  Diagnosis Date  . Medical history non-contributory          Past Surgical History:  Procedure Laterality Date  . NO PAST SURGERIES      No Known Allergies         Prior to Admission medications   Medication Sig Start Date End Date Taking? Authorizing Provider  acetaminophen (TYLENOL) 325 MG tablet Take 2 tablets (650 mg total) by mouth every 4 (four) hours as needed for mild pain or moderate pain. Patient not taking: Reported on 12/08/2017 09/30/17   [name], CNM  Prenatal Vit-Fe Fumarate-FA (MULTIVITAMIN-PRENATAL) 27-0.8 MG TABS tablet Take 1 tablet by mouth daily at 12 noon.    [provider]     Social History: She  reports that she has never smoked. She has never used smokeless tobacco. She reports that she does not drink alcohol or use drugs.  Family History: family history includes Arthritis in her maternal aunt; Diabetes in her maternal aunt.  no history of gyn cancers  Review of Systems: A full review of systems was performed and negative except as noted in the HPI.    Review of Systems: A full review of systems was performed and negativeexcept as noted in the HPI.  Eyes: no vision change  Ears: left ear pain  Oropharynx: no sore throat  Pulmonary . No shortness of breath , no hemoptysis Cardiovascular: no chest pain , no irregular heart beat  Gastrointestinal:no blood in stool . No diarrhea, no constipation Uro gynecologic: no dysuria , no pelvic pain Neurologic : no seizure , no migraines  Musculoskeletal:  no muscular weakness  O:  Objective   BP 117/73 (BP Location: Left Arm)   Pulse 98   Temp 98.1 F (36.7 C) (Oral)   Resp 16   Ht 5\' 1"  (1.549 m)   Wt 67.6 kg   LMP 04/25/2017 (Exact Date)   BMI 28.15 kg/m  No results found for this or any previous visit (from the past 48 hour(s)).  Constitutional: NAD, AAOx3  HE/ENT: extraocular movements grossly intact, moist mucous membranes CV: RRR PULM: nl respiratory effort, CTABL                                         Abd: gravid, non-tender, non-distended, soft                                                  Ext: Non-tender, Nonedmeatous                     Psych: mood appropriate, speech normal Pelvic 3.5 cm / 50 / -3 , same after recheck 3 hrs later  NST: reactive  Baseline:  Variability: moderate Accelerations present x >2 Decelerations absent Time    A/P:  32 y.o. [redacted]w[redacted]d here for antenatal surveillance for contraction , no labor   Labor: not present.   Fetal Wellbeing: Reassuring Cat 1 tracing.  Reactive NST   D/c home stable, precautions reviewed, follow-up as scheduled.   ----- Jennell Corner , MD Attending Obstetrician and Gynecologist [facility], Department of OB/GYN [facility]         Electronically signed by Hillery Zachman, Ihor Austin, MD at 01/15/2018 2:27 PM     ED to Hosp-Admission (Current) on 01/15/2018

## 2018-01-25 ENCOUNTER — Inpatient Hospital Stay
Admission: EM | Admit: 2018-01-25 | Discharge: 2018-01-27 | DRG: 807 | Disposition: A | Discharge status: Home / Self Care | Payer: Medicaid Other | Attending: Obstetrics and Gynecology | Admitting: Obstetrics and Gynecology

## 2018-01-25 ENCOUNTER — Other Ambulatory Visit: Payer: Self-pay

## 2018-01-25 DIAGNOSIS — O43123 Velamentous insertion of umbilical cord, third trimester: Secondary | ICD-10-CM | POA: Diagnosis present

## 2018-01-25 DIAGNOSIS — Z3A39 39 weeks gestation of pregnancy: Secondary | ICD-10-CM

## 2018-01-25 DIAGNOSIS — O99824 Streptococcus B carrier state complicating childbirth: Principal | ICD-10-CM | POA: Diagnosis present

## 2018-01-25 NOTE — Progress Notes (Signed)
Patient ID: Toni BrassJasmine Black, female   DOB: 03-31-1986, 32 y.o.   MRN: [id number]  Toni Black is a 32 y.o. female. She is at 1436w0d gestation. Patient's last menstrual period was 04/25/2017 (exact date). Estimated Date of Delivery: 02/01/18  Prenatal care site:  [facility]  Chief complaint: Reports to L&D with complaints of vaginal leaking.  S: States she had a large amount of white vaginal discharge that ran down her leg. Denies bleeding, or recent intercourse.  Reports irregular contractions with increasing intensity since yesterday.  Endorses good fetal movement.     Maternal Medical History:   Past Medical History:  Diagnosis Date  . Medical history non-contributory     Past Surgical History:  Procedure Laterality Date  . NO PAST SURGERIES      No Known Allergies  Prior to Admission medications   Medication Sig Start Date End Date Taking? Authorizing Provider  Prenatal Vit-Fe Fumarate-FA (MULTIVITAMIN-PRENATAL) 27-0.8 MG TABS tablet Take 1 tablet by mouth daily at 12 noon.   Yes [provider]  acetaminophen (TYLENOL) 325 MG tablet Take 2 tablets (650 mg total) by mouth every 4 (four) hours as needed for mild pain or moderate pain. Patient not taking: Reported on 12/08/2017 09/30/17   Genia DelHaviland, Margaret, CNM     Social History: She  reports that she has never smoked. She has never used smokeless tobacco. She reports that she does not drink alcohol or use drugs.  Family History: family history includes Arthritis in her maternal aunt; Asthma in her son; Diabetes in her maternal aunt; Hypertension in her father and mother; Migraines in her father.  Review of Systems: A full review of systems was performed and negative except as noted in the HPI.     O:  BP 109/74 (BP Location: Right Arm)   Pulse (!) 101   Temp 98.5 F (36.9 C) (Oral)   Resp 17   Ht 5\' 1"  (1.549 m)   Wt 65.3 kg   LMP 04/25/2017 (Exact Date)   BMI 27.21 kg/m  No results found for this or any previous  visit (from the past 48 hour(s)).   Constitutional: NAD, AAOx3  HE/ENT: extraocular movements grossly intact, moist mucous membranes CV: S1S2, no M/R/G  PULM: nl respiratory effort, CTABL     Abd: gravid, non-tender, non-distended, soft      Ext: Non-tender, Nonedmeatous   Psych: mood appropriate, speech normal Pelvic: spec exam performed with large amount of white vaginal discharge. No odor noted.  Slides for fern obtained, negative for ferning   NST: Reactive  Baseline: 130 Variability: moderate Accelerations present x >2 Decelerations absent Time 20mins  Assessment/Plan  A. 1. 39 0/7week IUP 2. Leukorrhea  3. Fetal tracing: Category 1 4. GBS Positive 5. History of rapid labors: 2 1/2 hour labor last delivery   P. 1. Ambulate x 1 hour and recheck cervix  2. Consider admission for early labor and GBS positive status    _______________________________________________  Myrtie Cruisearon W. Jones,RN, MSN, CNM, FNP Certified Nurse Midwife [facility]

## 2018-01-25 NOTE — Progress Notes (Signed)
Fetal monitors removed per Milon Scorearon Jones, CNM. Pt. Ambulating. Will continue to monitor.

## 2018-01-25 NOTE — OB Triage Note (Signed)
Pt. presented to triage with complaints of leakage of thick, white fluid a half an hour ago. No gush noted. No bleeding. Intermittent contractions noticed that were unrelieved by hydration or rest. Shooting pain in lower abdomen/back area rated 5/10. Nitrazine equivocal. Last intercourse three weeks prior.Last BM 9/20. Vitals stable. Will continue to monitor.

## 2018-01-26 ENCOUNTER — Other Ambulatory Visit: Payer: Self-pay

## 2018-01-26 DIAGNOSIS — O99824 Streptococcus B carrier state complicating childbirth: Secondary | ICD-10-CM | POA: Diagnosis present

## 2018-01-26 DIAGNOSIS — Z3483 Encounter for supervision of other normal pregnancy, third trimester: Secondary | ICD-10-CM | POA: Diagnosis present

## 2018-01-26 DIAGNOSIS — Z3A39 39 weeks gestation of pregnancy: Secondary | ICD-10-CM | POA: Diagnosis not present

## 2018-01-26 DIAGNOSIS — O43123 Velamentous insertion of umbilical cord, third trimester: Secondary | ICD-10-CM | POA: Diagnosis present

## 2018-01-26 LAB — CBC
HEMATOCRIT: 32 % — AB (ref 35.0–47.0)
Hemoglobin: 10.4 g/dL — ABNORMAL LOW (ref 12.0–16.0)
MCH: 23.9 pg — AB (ref 26.0–34.0)
MCHC: 32.6 g/dL (ref 32.0–36.0)
MCV: 73.4 fL — AB (ref 80.0–100.0)
Platelets: 341 10*3/uL (ref 150–440)
RBC: 4.35 MIL/uL (ref 3.80–5.20)
RDW: 15.4 % — AB (ref 11.5–14.5)
WBC: 10.3 10*3/uL (ref 3.6–11.0)

## 2018-01-26 LAB — TYPE AND SCREEN
ABO/RH(D): O POS
Antibody Screen: NEGATIVE

## 2018-01-26 MED ORDER — SOD CITRATE-CITRIC ACID 500-334 MG/5ML PO SOLN: 30.0000 mL | ORAL | Status: DC | PRN

## 2018-01-26 MED ORDER — SODIUM CHLORIDE 0.9% FLUSH: 3.0000 mL | INTRAVENOUS | Status: DC | PRN

## 2018-01-26 MED ORDER — LACTATED RINGERS IV SOLN
INTRAVENOUS | Status: DC
  Administered 2018-01-26: 02:00:00 via INTRAVENOUS

## 2018-01-26 MED ORDER — ONDANSETRON HCL 4 MG/2ML IJ SOLN: 4.0000 mg | INTRAMUSCULAR | Status: DC | PRN

## 2018-01-26 MED ORDER — SENNOSIDES-DOCUSATE SODIUM 8.6-50 MG PO TABS
2.0000 | ORAL_TABLET | ORAL | Status: DC
  Filled 2018-01-26: qty 2

## 2018-01-26 MED ORDER — WITCH HAZEL-GLYCERIN EX PADS: 1.0000 "application " | MEDICATED_PAD | CUTANEOUS | Status: DC | PRN

## 2018-01-26 MED ORDER — BENZOCAINE-MENTHOL 20-0.5 % EX AERO
INHALATION_SPRAY | CUTANEOUS | Status: AC
  Filled 2018-01-26: qty 56

## 2018-01-26 MED ORDER — OXYCODONE HCL 5 MG PO TABS: 5.0000 mg | ORAL_TABLET | ORAL | Status: DC | PRN

## 2018-01-26 MED ORDER — COCONUT OIL OIL: 1.0000 "application " | TOPICAL_OIL | Status: DC | PRN

## 2018-01-26 MED ORDER — SIMETHICONE 80 MG PO CHEW: 80.0000 mg | CHEWABLE_TABLET | ORAL | Status: DC | PRN

## 2018-01-26 MED ORDER — OXYTOCIN BOLUS FROM INFUSION
500.0000 mL | Freq: Once | INTRAVENOUS | Status: AC
  Administered 2018-01-26: 500 mL via INTRAVENOUS

## 2018-01-26 MED ORDER — SODIUM CHLORIDE 0.9 % IV SOLN: 250.0000 mL | INTRAVENOUS | Status: DC | PRN

## 2018-01-26 MED ORDER — ACETAMINOPHEN 325 MG PO TABS: 650.0000 mg | ORAL_TABLET | ORAL | Status: DC | PRN

## 2018-01-26 MED ORDER — ONDANSETRON HCL 4 MG PO TABS: 4.0000 mg | ORAL_TABLET | ORAL | Status: DC | PRN

## 2018-01-26 MED ORDER — ZOLPIDEM TARTRATE 5 MG PO TABS: 5.0000 mg | ORAL_TABLET | Freq: Every evening | ORAL | Status: DC | PRN

## 2018-01-26 MED ORDER — BENZOCAINE-MENTHOL 20-0.5 % EX AERO: 1.0000 "application " | INHALATION_SPRAY | CUTANEOUS | Status: DC | PRN

## 2018-01-26 MED ORDER — DIBUCAINE 1 % RE OINT: 1.0000 "application " | TOPICAL_OINTMENT | RECTAL | Status: DC | PRN

## 2018-01-26 MED ORDER — IBUPROFEN 600 MG PO TABS
600.0000 mg | ORAL_TABLET | Freq: Four times a day (QID) | ORAL | Status: DC
  Filled 2018-01-26 (×3): qty 1

## 2018-01-26 MED ORDER — CLOBETASOL PROPIONATE 0.05 % EX CREA
TOPICAL_CREAM | Freq: Two times a day (BID) | CUTANEOUS | Status: DC
  Administered 2018-01-26 – 2018-01-27 (×2): via TOPICAL
  Filled 2018-01-26: qty 15

## 2018-01-26 MED ORDER — MISOPROSTOL 200 MCG PO TABS
ORAL_TABLET | ORAL | Status: AC
  Filled 2018-01-26: qty 4

## 2018-01-26 MED ORDER — MEASLES, MUMPS & RUBELLA VAC ~~LOC~~ INJ
0.5000 mL | INJECTION | Freq: Once | SUBCUTANEOUS | Status: DC
  Filled 2018-01-26: qty 0.5

## 2018-01-26 MED ORDER — TETANUS-DIPHTH-ACELL PERTUSSIS 5-2.5-18.5 LF-MCG/0.5 IM SUSP: 0.5000 mL | Freq: Once | INTRAMUSCULAR | Status: DC

## 2018-01-26 MED ORDER — SODIUM CHLORIDE 0.9 % IV SOLN
2.0000 g | Freq: Once | INTRAVENOUS | Status: AC
  Administered 2018-01-26: 2 g via INTRAVENOUS
  Filled 2018-01-26: qty 2000

## 2018-01-26 MED ORDER — DIPHENHYDRAMINE HCL 25 MG PO CAPS: 25.0000 mg | ORAL_CAPSULE | Freq: Four times a day (QID) | ORAL | Status: DC | PRN

## 2018-01-26 MED ORDER — SODIUM CHLORIDE 0.9 % IV SOLN
1.0000 g | INTRAVENOUS | Status: DC
  Administered 2018-01-26: 1 g via INTRAVENOUS
  Filled 2018-01-26: qty 1000

## 2018-01-26 MED ORDER — BISACODYL 10 MG RE SUPP: 10.0000 mg | Freq: Every day | RECTAL | Status: DC | PRN

## 2018-01-26 MED ORDER — OXYTOCIN 40 UNITS IN LACTATED RINGERS INFUSION - SIMPLE MED
2.5000 [IU]/h | INTRAVENOUS | Status: DC
  Filled 2018-01-26: qty 1000

## 2018-01-26 MED ORDER — LACTATED RINGERS IV SOLN: 500.0000 mL | INTRAVENOUS | Status: DC | PRN

## 2018-01-26 MED ORDER — SODIUM CHLORIDE 0.9% FLUSH: 3.0000 mL | Freq: Two times a day (BID) | INTRAVENOUS | Status: DC

## 2018-01-26 MED ORDER — PRENATAL MULTIVITAMIN CH
1.0000 | ORAL_TABLET | Freq: Every day | ORAL | Status: DC
  Administered 2018-01-26: 1 via ORAL
  Filled 2018-01-26 (×2): qty 1

## 2018-01-26 MED ORDER — BUTORPHANOL TARTRATE 2 MG/ML IJ SOLN: 1.0000 mg | INTRAMUSCULAR | Status: DC | PRN

## 2018-01-26 MED ORDER — FLEET ENEMA 7-19 GM/118ML RE ENEM: 1.0000 | ENEMA | Freq: Every day | RECTAL | Status: DC | PRN

## 2018-01-26 MED ORDER — LIDOCAINE HCL (PF) 1 % IJ SOLN
30.0000 mL | INTRAMUSCULAR | Status: DC | PRN
  Filled 2018-01-26: qty 30

## 2018-01-26 MED ORDER — ONDANSETRON HCL 4 MG/2ML IJ SOLN: 4.0000 mg | Freq: Four times a day (QID) | INTRAMUSCULAR | Status: DC | PRN

## 2018-01-26 MED ORDER — MEDROXYPROGESTERONE ACETATE 150 MG/ML IM SUSP
150.0000 mg | INTRAMUSCULAR | Status: DC | PRN
  Filled 2018-01-26: qty 1

## 2018-01-26 NOTE — H&P (Signed)
Toni BrassJasmine Black is a 32 y.o. female presenting for early labor .PNC at [facility] with unsure LMP and EDD on 02/01/18 by US 8 2/7 weeks.C/O "vag leaking of white dc today, no VB, no decreased FM, +UC's on arrival. LGSIL pap with need for PP colp.  OB History    Gravida  5   Para  4   Term  3   Preterm  1   AB      Living  4     SAB      TAB      Ectopic      Multiple  0   Live Births  4          Past Medical History:  Diagnosis Date  . Medical history non-contributory   History of abnormal pap smear 06/27/17 w/ LSIL  Past Surgical History:  Procedure Laterality Date  . NO PAST SURGERIES     Family History: family history includes Arthritis in her maternal aunt; Asthma in her son; Diabetes in her maternal aunt; Hypertension in her father and mother; Migraines in her father. Social History:  reports that she has never smoked. She has never used smokeless tobacco. She reports that she does not drink alcohol or use drugs. Social History   Socioeconomic History  . Marital status: Divorced    Spouse name: Not on file  . Number of children: Not on file  . Years of education: Not on file  . Highest education level: Not on file  Occupational History    Employer: [employer]  Social Needs  . Financial resource strain: Somewhat hard  . Food insecurity:    Worry: Sometimes true    Inability: Sometimes true  . Transportation needs:    Medical: No    Non-medical: No  Tobacco Use  . Smoking status: Never Smoker  . Smokeless tobacco: Never Used  Substance and Sexual Activity  . Alcohol use: No  . Drug use: No  . Sexual activity: Yes    Birth control/protection: Injection    Comment: Depo  Lifestyle  . Physical activity:    Days per week: 4 days    Minutes per session: Not on file  . Stress: Not at all  Relationships  . Social connections:    Talks on phone: Once a week    Gets together: Twice a week    Attends religious service: 1 to 4 times per year    Active member of  club or organization: No    Attends meetings of clubs or organizations: Never    Relationship status: Divorced  . Intimate partner violence:    Fear of current or ex partner: No    Emotionally abused: No    Physically abused: No    Forced sexual activity: No  Other Topics Concern  . Not on file  Social History Narrative  . Not on file      Maternal Diabetes: No Genetic Screening: Declined Maternal Ultrasounds/Referrals: Normal Anatomy scan Fetal Ultrasounds or other Referrals:  None Maternal Substance Abuse:  No Significant Maternal Medications:  None Significant Maternal Lab Results:  None Other Comments:  History of rapid labors, History of PPH w/ first delivery, did not require blood transfusion   Review of Systems  Constitutional: Negative.   HENT: Negative.   Eyes: Negative.   Respiratory: Negative.   Cardiovascular: Negative.   Gastrointestinal: Negative.   Genitourinary: Negative.   Musculoskeletal: Negative.   Neurological: Negative.   Psychiatric/Behavioral: Negative.    History  Dilation: 5 Effacement (%): 70 Station: -2 Exam by:: A. Mackie SNM Blood pressure 109/74, pulse (!) 101, temperature 98.5 F (36.9 C), temperature source Oral, resp. rate 17, height 5\' 1"  (1.549 m), weight 65.3 kg, last menstrual period 04/25/2017, unknown if currently breastfeeding.  Physical Exam  Gen:A,A&Ox3 HEENT: Normocephalic, Eyes non-icteric. HEART:S1S2, RRR, No M/R/G LUNGS:CTA bilat, no W/R/R ABD: Gravid, EFW 5#7oz  Extrems:warm, dry, NT, Neg Homan's   Prenatal labs: ABO, Rh:  O Pos Antibody:  Negative Rubella:  Immune RPR:   Nonreactive  HBsAg:   Negative HIV:   Negative Varicella: Immune GBS:  Positive   Assessment/Plan: A. 1. IUP at 39 1/7 weeks in early labor 2. GBS positive: concerned about getting 2 doses of antibiotics prior to delivery 3. History of PPH - did not require blood transfusion 4. History of rapid labor   5. LGSIL:needs PP COLP P. 1.  Admit to L&D for early labor 2. GBS prophylaxis with ampicillin  3. Cont to monitor UC/FHT and VS.   _____________________________________________________  Myrtie Cruise, MSN, CNM, FNP Certified Nurse Midwife [facility]  Sharee Pimple 01/26/2018, 1:01 AM

## 2018-01-26 NOTE — Discharge Summary (Signed)
Obstetrical Discharge Summary  Patient Name: Toni BrassJasmine Black DOB: 1986-04-05 MRN: [id number]  Date of Admission: 01/25/2018 Date of Delivery: 01/26/2018 Delivered by: Margaretmary EddyAnna Mackie SNM and Milon Scorearon Jones, CNM  Date of Discharge: 01/27/18 Primary OB: [facility] WUJ:[id number]:Patient's last menstrual period was 04/25/2017 (exact date). EDC Estimated Date of Delivery: 02/01/18 Gestational Age at Delivery: 6230w1d   Antepartum complications: GBS Positive  Admitting Diagnosis: Early labor, GBS positive  Secondary Diagnosis: Patient Active Problem List   Diagnosis Date Noted  . Indication for care in labor or delivery 01/26/2018  . Uterine contractions during pregnancy 01/15/2018  . Decreased fetal movement affecting management of mother, antepartum 12/18/2017  . Pregnancy 12/08/2017  . Vaginal discharge during pregnancy in third trimester 11/13/2017  . Abdominal pain 09/30/2017  . Normal labor and delivery 04/16/2015  . Premature uterine contractions 04/13/2015  . Irregular uterine contractions 04/13/2015  . Preterm contractions 03/25/2015  . Pelvic pain complicating pregnancy 02/23/2015    Augmentation: AROM Complications: None Intrapartum complications/course:  Date of Delivery: 01/26/2018 Delivered By: Margaretmary EddyAnna Mackie, SNM, and Milon Scorearon Jones, CNM  Delivery Type: spontaneous vaginal delivery Anesthesia: None  Placenta: sponatneous, discarded  Laceration: None Episiotomy: none Newborn Data: Live born female  Birth Weight:   APGAR: 9, 9  Newborn Delivery   Birth date/time:  01/26/2018 07:13:00 Delivery type:  Vaginal, Spontaneous       Postpartum Procedures: N/A  Post partum course: Stable  Patient had an uncomplicated postpartum course.  By time of discharge on PPD#1, her pain was controlled on oral pain medications; she had appropriate lochia and was ambulating, voiding without difficulty and tolerating regular diet.  She was deemed stable for discharge to home.     Discharge Physical Exam:  01/27/2018  BP 120/83 (BP Location: Left Arm)   Pulse 96   Temp 98.1 F (36.7 C) (Oral)   Resp 18   Ht 5\' 1"  (1.549 m)   Wt 65.3 kg   LMP 04/25/2017 (Exact Date)   SpO2 100%   Breastfeeding? Unknown   BMI 27.21 kg/m   General: NAD CV: RRR Pulm: CTABL, nl effort ABD: s/nd/nt, fundus firm and below the umbilicus Lochia: moderate  DVT Evaluation: LE non-ttp, no evidence of DVT on exam.  Hemoglobin  Date Value Ref Range Status  01/27/2018 9.5 (L) 12.0 - 16.0 g/dL Final   HGB  Date Value Ref Range Status  04/17/2013 10.6 (L) 12.0 - 16.0 g/dL Final   HCT  Date Value Ref Range Status  01/27/2018 29.0 (L) 35.0 - 47.0 % Final  04/18/2013 26.9 (L) 35.0 - 47.0 % Final     Disposition: stable, discharge to home. Baby Feeding: formula Baby Disposition: home with mom  Rh Immune globulin given: Rh Positive  Rubella vaccine given: Rubella Immune  Tdap vaccine given in AP or PP setting: 11/06/2017 Flu vaccine given in AP or PP setting: prior to discharge   Contraception: Depo Provera on d/c   Prenatal Labs:  Type and RH: O pos Rubella: Immune Hep B: Negative RPR: Non reactive Varicella: Immune GBS: +/ and treated    Plan:  Toni Black was discharged to home in good condition. Follow-up appointment with delivering provider in 6 weeks.  Discharge Medications: Allergies as of 01/27/2018   No Known Allergies     Medication List    STOP taking these medications   acetaminophen 325 MG tablet Commonly known as:  TYLENOL     TAKE these medications   ibuprofen 600 MG tablet Commonly known as:  ADVIL,MOTRIN Take 1 tablet (600 mg total) by mouth every 6 (six) hours.   multivitamin-prenatal 27-0.8 MG Tabs tablet Take 1 tablet by mouth daily at 12 noon.       Follow-up Information    Christeen Douglas, MD. Schedule an appointment as soon as possible for a visit in 6 week(s).   Specialty:  Obstetrics and Gynecology Why:  Please call to schedule your 6 week  postpartum follow up appointment with Dr. Leretha Dykes information: [address] [place] [address] [phone]           Signed: Jennell Corner MD

## 2018-01-26 NOTE — Progress Notes (Signed)
Notified Jones, CNM of pt c/o rash in groin area. Rash was not red but looked more dry and plaque like in nature. CNM to put orders in

## 2018-01-27 LAB — CBC
HCT: 29 % — ABNORMAL LOW (ref 35.0–47.0)
Hemoglobin: 9.5 g/dL — ABNORMAL LOW (ref 12.0–16.0)
MCH: 23.9 pg — ABNORMAL LOW (ref 26.0–34.0)
MCHC: 32.6 g/dL (ref 32.0–36.0)
MCV: 73.2 fL — AB (ref 80.0–100.0)
PLATELETS: 304 10*3/uL (ref 150–440)
RBC: 3.96 MIL/uL (ref 3.80–5.20)
RDW: 15.8 % — AB (ref 11.5–14.5)
WBC: 16 10*3/uL — AB (ref 3.6–11.0)

## 2018-01-27 LAB — RPR: RPR: NONREACTIVE

## 2018-01-27 LAB — HIV ANTIBODY (ROUTINE TESTING W REFLEX): HIV SCREEN 4TH GENERATION: NONREACTIVE

## 2018-01-27 MED ORDER — MEDROXYPROGESTERONE ACETATE 150 MG/ML IM SUSP
150.0000 mg | Freq: Once | INTRAMUSCULAR | Status: AC
  Administered 2018-01-27: 150 mg via INTRAMUSCULAR
  Filled 2018-01-27: qty 1

## 2018-01-27 MED ORDER — IBUPROFEN 600 MG PO TABS: 600.0000 mg | ORAL_TABLET | Freq: Four times a day (QID) | ORAL | 0 refills | Status: DC

## 2018-01-27 NOTE — Progress Notes (Signed)
Discharge order received from doctor. Depo given prior to discharge. Reviewed discharge instructions and prescriptions with patient and answered all questions. Follow up appointment instructions given. Patient verbalized understanding. ID bands checked. Patient discharged home with infant via wheelchair by nursing/auxillary.    Oswald HillockAbigail Garner, RN

## 2018-01-27 NOTE — Discharge Instructions (Signed)
Please call your doctor or return to the ER if you experience any chest pains, shortness of breath, dizziness, visual changes, fever greater than 101, any heavy bleeding (saturating more than 1 pad per hour), large clots, or foul smelling discharge, any worsening abdominal pain and cramping that is not controlled by pain medication, or any signs of postpartum depression. No tampons, enemas, douches, or sexual intercourse for 6 weeks. Also avoid tub baths, hot tubs, or swimming for 6 weeks.  °

## 2018-03-12 DIAGNOSIS — R87612 Low grade squamous intraepithelial lesion on cytologic smear of cervix (LGSIL): Secondary | ICD-10-CM | POA: Insufficient documentation

## 2018-04-15 DIAGNOSIS — D649 Anemia, unspecified: Secondary | ICD-10-CM | POA: Insufficient documentation

## 2018-04-15 HISTORY — DX: Anemia, unspecified: D64.9

## 2018-09-23 ENCOUNTER — Other Ambulatory Visit: Payer: Self-pay

## 2018-09-23 ENCOUNTER — Emergency Department
Admission: EM | Admit: 2018-09-23 | Discharge: 2018-09-23 | Disposition: A | Discharge status: Home / Self Care | Payer: Medicaid Other | Attending: Emergency Medicine | Admitting: Emergency Medicine

## 2018-09-23 DIAGNOSIS — R220 Localized swelling, mass and lump, head: Secondary | ICD-10-CM

## 2018-09-23 DIAGNOSIS — K047 Periapical abscess without sinus: Secondary | ICD-10-CM | POA: Insufficient documentation

## 2018-09-23 MED ORDER — AMOXICILLIN 500 MG PO CAPS: 500.0000 mg | ORAL_CAPSULE | Freq: Once | ORAL | Status: DC

## 2018-09-23 MED ORDER — AMOXICILLIN 400 MG/5ML PO SUSR: 500.0000 mg | Freq: Three times a day (TID) | ORAL | 0 refills | Status: AC

## 2018-09-23 MED ORDER — AMOXICILLIN 250 MG/5ML PO SUSR
500.0000 mg | Freq: Once | ORAL | Status: AC
  Administered 2018-09-23: 500 mg via ORAL
  Filled 2018-09-23: qty 10

## 2018-09-23 NOTE — ED Triage Notes (Signed)
Pt c/o swelling to the left side of her face since yesterday , I think my gums are infected"

## 2018-09-23 NOTE — Discharge Instructions (Signed)
Take the prescription meds as directed. Follow-up with a local dental provider for routine dental care and extractions.   OPTIONS FOR DENTAL FOLLOW UP CARE  [place] [facility] - Local Safety Net Dental Clinics [email or web address]   [facility] [phone])  [facility] [phone])  [place] [phone] ext [phone])  [facility] [phone])  [facility] [phone]) This clinic caters to the indigent population and is on a lottery system. Location: [employer] of Dentistry, [employer], [address] [place], [place] Clinic Hours: Wednesdays from 6pm - 9pm, patients seen by a lottery system. For dates, call or go to [email or web address] Services: Cleanings, fillings and simple extractions. Payment Options: DENTAL WORK IS FREE OF CHARGE. Bring proof of income or support. Best way to get seen: Arrive at 5:15 pm - this is a lottery, NOT first come/first serve, so arriving earlier will not increase your chances of being seen.     [facility] [phone] Select option 1 for emergencies   Location: [facility] of Dentistry, [place], [address] [place] Clinic Hours: No walk-ins accepted - call the day before to schedule an appointment. Check in times are 9:30 am and 1:30 pm. Services: Simple extractions, temporary fillings, pulpectomy/pulp debridement, uncomplicated abscess drainage. Payment Options: PAYMENT IS DUE AT THE TIME OF SERVICE.  Fee is usually $100-200, additional surgical procedures (e.g. abscess drainage) may be extra. Cash, checks, Visa/MasterCard accepted.  Can file Medicaid if patient is covered for dental - patient should call case worker to check. No discount for [facility] patients. Best way to get seen: MUST call the day before and get onto the  schedule. Can usually be seen the next 1-2 days. No walk-ins accepted.     [facility] [phone]   Location: [facility], [address] [place] Clinic Hours: M, W, Th, F 8am or 1:30pm, Tues 9a or 1:30 - first come/first served. Services: Simple extractions, temporary fillings, uncomplicated abscess drainage.  You do not need to be an [facility] resident. Payment Options: PAYMENT IS DUE AT THE TIME OF SERVICE. Dental insurance, otherwise sliding scale - bring proof of income or support. Depending on income and treatment needed, cost is usually $50-200. Best way to get seen: Arrive early as it is first come/first served.     [facility] [phone]   Location: [address] Clinic Hours: Mon-Thu 8a-5p Services: Most basic dental services including extractions and fillings. Payment Options: PAYMENT IS DUE AT THE TIME OF SERVICE. Sliding scale, up to 50% off - bring proof if income or support. Medicaid with dental option accepted. Best way to get seen: Call to schedule an appointment, can usually be seen within 2 weeks OR they will try to see walk-ins - show up at 8a or 2p (you may have to wait).     [facility] [phone] [place] RESIDENTS ONLY   Location: [facility], [address] [place], [place] [address] Clinic Hours: By appointment only. Monday - Thursday 8am-5pm, Friday 8am-12pm Services: Cleanings, fillings, extractions. Payment Options: PAYMENT IS DUE AT THE TIME OF SERVICE. Cash, Visa or MasterCard. Sliding scale - $30 minimum per service. Best way to get seen: Come in to office, complete packet and make an appointment - need proof of income or support monies for each household member and proof of [facility] residence. Usually takes about a  month to get in.     [facility] [phone]    Location: [address] [place] Clinic Hours: Walk-in Urgent Care Dental Services are offered Monday-Friday mornings only. The numbers of emergencies accepted daily is limited to the number of providers available. Maximum 15 - Mondays, Wednesdays & Thursdays Maximum 10 - Tuesdays & Fridays Services: You do not need to be a [facility] resident to be seen for a dental emergency. Emergencies are defined as pain, swelling, abnormal bleeding, or dental trauma. Walkins will receive x-rays if needed. NOTE: Dental cleaning is not an emergency. Payment Options: PAYMENT IS DUE AT THE TIME OF SERVICE. Minimum co-pay is $40.00 for uninsured patients. Minimum co-pay is $3.00 for Medicaid with dental coverage. Dental Insurance is accepted and must be presented at time of visit. Medicare does not cover dental. Forms of payment: Cash, credit card, checks. Best way to get seen: If not previously registered with the clinic, walk-in dental registration begins at 7:15 am and is on a first come/first serve basis. If previously registered with the clinic, call to make an appointment.     [facility] [place] ONLY   Location: [address] [place], [place] Clinic Hours: Mon-Thu 10a-2p Services: Extractions only! Payment Options: FREE (donations accepted) - bring proof of income or support Best way to get seen: Call and schedule an appointment OR come at 8am on the 1st Monday of every month (except for holidays) when it is first come/first served.     [facility] [phone]   Location: [place], [place] Clinic Hours: Friday mornings Services, Payment Options, Best way to get seen: Call for info

## 2018-09-23 NOTE — ED Notes (Signed)
See triage note  Presents with swelling to left side of face for 1-2 days   Swelling noted to left jaw area and under left eye  Some redness noted  Denies any pain unsure if she has a dental abscess

## 2018-09-23 NOTE — ED Provider Notes (Signed)
[facility] Emergency Department Provider Note ____________________________________________  Time seen: 1306  I have reviewed the triage vital signs and the nursing notes.  HISTORY  Chief Complaint  Facial Swelling  HPI Toni Black is a 33 y.o. female presents herself to the ED for evaluation of left facial swelling.  She reports a chronically broken left upper premolar, but denies any focal gum swelling, spontaneous drainage, or dental pain.  She reports awakening with swelling to the left side of her face.  Denies any interim fevers, chills, or sweats.  She is been able to control secretions, and has had no difficulty breathing ow swallowing.   Past Medical History:  Diagnosis Date  . Medical history non-contributory     Patient Active Problem List   Diagnosis Date Noted  . Indication for care in labor or delivery 01/26/2018  . Uterine contractions during pregnancy 01/15/2018  . Decreased fetal movement affecting management of mother, antepartum 12/18/2017  . Pregnancy 12/08/2017  . Vaginal discharge during pregnancy in third trimester 11/13/2017  . Abdominal pain 09/30/2017  . Normal labor and delivery 04/16/2015  . Premature uterine contractions 04/13/2015  . Irregular uterine contractions 04/13/2015  . Preterm contractions 03/25/2015  . Pelvic pain complicating pregnancy 02/23/2015    Past Surgical History:  Procedure Laterality Date  . NO PAST SURGERIES      Prior to Admission medications   Medication Sig Start Date End Date Taking? Authorizing Provider  amoxicillin (AMOXIL) 400 MG/5ML suspension Take 6.3 mLs (500 mg total) by mouth 3 (three) times daily for 10 days. 09/23/18 10/03/18  Tomoki Lucken, Charlesetta Ivory, PA-C    Allergies Patient has no known allergies.  Family History  Problem Relation Age of Onset  . Arthritis Maternal Aunt   . Diabetes Maternal Aunt   . Hypertension Mother   . Hypertension Father   . Migraines Father   .  Asthma Son     Social History Social History   Tobacco Use  . Smoking status: Never Smoker  . Smokeless tobacco: Never Used  Substance Use Topics  . Alcohol use: No  . Drug use: No    Review of Systems  Constitutional: Negative for fever. Eyes: Negative for visual changes. ENT: Negative for sore throat. Dental pain & swelling Cardiovascular: Negative for chest pain. Respiratory: Negative for shortness of breath. Gastrointestinal: Negative for abdominal pain, vomiting and diarrhea. Genitourinary: Negative for dysuria. Musculoskeletal: Negative for back pain. Skin: Negative for rash. Neurological: Negative for headaches, focal weakness or numbness. ____________________________________________  PHYSICAL EXAM:  VITAL SIGNS: ED Triage Vitals  Enc Vitals Group     BP 09/23/18 1253 123/78     Pulse Rate 09/23/18 1253 (!) 110     Resp 09/23/18 1253 18     Temp 09/23/18 1253 98 F (36.7 C)     Temp Source 09/23/18 1253 Oral     SpO2 09/23/18 1253 98 %     Weight 09/23/18 1249 160 lb (72.6 kg)     Height 09/23/18 1249 5\' 2"  (1.575 m)     Head Circumference --      Peak Flow --      Pain Score 09/23/18 1249 0     Pain Loc --      Pain Edu? --      Excl. in GC? --     Constitutional: Alert and oriented. Well appearing and in no distress. Head: Normocephalic and atraumatic. Eyes: Conjunctivae are normal. Normal extraocular movements Ears: Canals clear. TMs  intact bilaterally. Nose: No congestion/rhinorrhea/epistaxis. Mouth/Throat: Mucous membranes are moist. Uvula is midline and tonsils are flat. Facial swelling to the left face. No focal gumline swelling, pointing, or spontaneous drainage.  Neck: Supple. No thyromegaly. Hematological/Lymphatic/Immunological: No cervical lymphadenopathy. Cardiovascular: Normal rate, regular rhythm. Normal distal pulses. Respiratory: Normal respiratory effort. No wheezes/rales/rhonchi. Musculoskeletal: Nontender with normal range of  motion in all extremities.  Neurologic:  Normal gait without ataxia. Normal speech and language. No gross focal neurologic deficits are appreciated. Skin:  Skin is warm, dry and intact. No rash noted. Psychiatric: Mood and affect are normal. Patient exhibits appropriate insight and judgment. ____________________________________________  PROCEDURES  Procedures Amoxicillin suspension 500 mg PO ____________________________________________  INITIAL IMPRESSION / ASSESSMENT AND PLAN / ED COURSE  Toni Black was evaluated in Emergency Department on 09/23/2018 for the symptoms described in the history of present illness. She was evaluated in the context of the global COVID-19 pandemic, which necessitated consideration that the patient might be at risk for infection with the SARS-CoV-2 virus that causes COVID-19. Institutional protocols and algorithms that pertain to the evaluation of patients at risk for COVID-19 are in a state of rapid change based on information released by regulatory bodies including the CDC and federal and state organizations. These policies and algorithms were followed during the patient's care in the ED.  DDX: cellulitis, preorbital cellulitis, contusion, fracture, dental infection  Patient with ED evaluation of left facial swelling concerning for dental abscess. Patient will be treated with amoxicillin suspension. She is referred to any local dental provider for definitive treatment. Return precautions are reviewed.  ____________________________________________  FINAL CLINICAL IMPRESSION(S) / ED DIAGNOSES  Final diagnoses:  Dental infection  Left facial swelling      Karmen StabsMenshew, Charlesetta IvoryJenise V Bacon, PA-C 09/23/18 1632    Sharman CheekStafford, Phillip, MD 09/24/18 1555

## 2018-12-03 DIAGNOSIS — F4323 Adjustment disorder with mixed anxiety and depressed mood: Secondary | ICD-10-CM

## 2018-12-03 DIAGNOSIS — Z91410 Personal history of adult physical and sexual abuse: Secondary | ICD-10-CM

## 2018-12-04 ENCOUNTER — Other Ambulatory Visit: Payer: Self-pay

## 2018-12-04 ENCOUNTER — Ambulatory Visit (LOCAL_COMMUNITY_HEALTH_CENTER): Payer: Medicaid Other

## 2018-12-04 VITALS — BP 122/75 | Ht 61.0 in | Wt 164.5 lb

## 2018-12-04 DIAGNOSIS — Z3009 Encounter for other general counseling and advice on contraception: Secondary | ICD-10-CM | POA: Diagnosis not present

## 2018-12-04 DIAGNOSIS — Z30013 Encounter for initial prescription of injectable contraceptive: Secondary | ICD-10-CM | POA: Diagnosis not present

## 2018-12-04 DIAGNOSIS — Z3042 Encounter for surveillance of injectable contraceptive: Secondary | ICD-10-CM

## 2018-12-04 MED ORDER — MEDROXYPROGESTERONE ACETATE 150 MG/ML IM SUSP
150.0000 mg | Freq: Once | INTRAMUSCULAR | Status: AC
  Administered 2018-12-04: 150 mg via INTRAMUSCULAR

## 2018-12-04 NOTE — Progress Notes (Signed)
PP exam 04/15/2018 at [facility]. Last depo at [facility] 09/18/2018; 11.0 weeks post depo. DMPA 150 mg IM today per Hassell Done, FNP order dated 04/15/2018.

## 2019-03-25 ENCOUNTER — Other Ambulatory Visit: Payer: Self-pay

## 2019-03-25 ENCOUNTER — Ambulatory Visit (LOCAL_COMMUNITY_HEALTH_CENTER): Payer: Medicaid Other

## 2019-03-25 DIAGNOSIS — Z3202 Encounter for pregnancy test, result negative: Secondary | ICD-10-CM | POA: Diagnosis not present

## 2019-03-25 LAB — PREGNANCY, URINE: Preg Test, Ur: NEGATIVE

## 2019-03-25 NOTE — Progress Notes (Signed)
Pt ready to leave as soon as she was informed about negative UPT test results, declined to stay for additional questions and vitals. Pt counseled that last depo was 12/04/2018 and physical is still current (PP 04/15/2018); 15.6 weeks post depo today. Pt declined DMPA 150 mg IM today, and declined all other services at [facility] today.

## 2019-06-18 ENCOUNTER — Telehealth: Payer: Self-pay | Admitting: Licensed Clinical Social Worker

## 2019-06-18 NOTE — Telephone Encounter (Signed)
patient called requesting appointment. Due to schedule conflict LCSW provided informaiton on [place] and [facility].

## 2019-07-15 ENCOUNTER — Ambulatory Visit: Payer: Medicaid Other

## 2019-12-30 ENCOUNTER — Emergency Department: Payer: HRSA Program

## 2019-12-30 ENCOUNTER — Other Ambulatory Visit: Payer: Self-pay

## 2019-12-30 ENCOUNTER — Emergency Department
Admission: EM | Admit: 2019-12-30 | Discharge: 2019-12-30 | Disposition: A | Discharge status: Home / Self Care | Payer: HRSA Program | Attending: Emergency Medicine | Admitting: Emergency Medicine

## 2019-12-30 DIAGNOSIS — U071 COVID-19: Secondary | ICD-10-CM

## 2019-12-30 DIAGNOSIS — R519 Headache, unspecified: Secondary | ICD-10-CM | POA: Diagnosis present

## 2019-12-30 LAB — CBC
HCT: 36.8 % (ref 36.0–46.0)
Hemoglobin: 12.2 g/dL (ref 12.0–15.0)
MCH: 26.4 pg (ref 26.0–34.0)
MCHC: 33.2 g/dL (ref 30.0–36.0)
MCV: 79.7 fL — ABNORMAL LOW (ref 80.0–100.0)
Platelets: 223 10*3/uL (ref 150–400)
RBC: 4.62 MIL/uL (ref 3.87–5.11)
RDW: 14.7 % (ref 11.5–15.5)
WBC: 6.3 10*3/uL (ref 4.0–10.5)
nRBC: 0 % (ref 0.0–0.2)

## 2019-12-30 LAB — COMPREHENSIVE METABOLIC PANEL
ALT: 22 U/L (ref 0–44)
AST: 25 U/L (ref 15–41)
Albumin: 4.1 g/dL (ref 3.5–5.0)
Alkaline Phosphatase: 61 U/L (ref 38–126)
Anion gap: 10 (ref 5–15)
BUN: 10 mg/dL (ref 6–20)
CO2: 21 mmol/L — ABNORMAL LOW (ref 22–32)
Calcium: 8.9 mg/dL (ref 8.9–10.3)
Chloride: 103 mmol/L (ref 98–111)
Creatinine, Ser: 1.19 mg/dL — ABNORMAL HIGH (ref 0.44–1.00)
GFR calc Af Amer: >60 mL/min (ref >60)
GFR calc non Af Amer: 60 mL/min — ABNORMAL LOW (ref >60)
Glucose, Bld: 120 mg/dL — ABNORMAL HIGH (ref 70–99)
Potassium: 3.7 mmol/L (ref 3.5–5.1)
Sodium: 134 mmol/L — ABNORMAL LOW (ref 135–145)
Total Bilirubin: 0.7 mg/dL (ref 0.3–1.2)
Total Protein: 7.9 g/dL (ref 6.5–8.1)

## 2019-12-30 LAB — URINALYSIS, COMPLETE (UACMP) WITH MICROSCOPIC
Bacteria, UA: NONE SEEN
Bilirubin Urine: NEGATIVE
Glucose, UA: NEGATIVE mg/dL
Ketones, ur: NEGATIVE mg/dL
Leukocytes,Ua: NEGATIVE
Nitrite: NEGATIVE
Protein, ur: NEGATIVE mg/dL
Specific Gravity, Urine: 1.014 (ref 1.005–1.030)
pH: 5 (ref 5.0–8.0)

## 2019-12-30 LAB — PREGNANCY, URINE: Preg Test, Ur: NEGATIVE

## 2019-12-30 LAB — LIPASE, BLOOD: Lipase: 30 U/L (ref 11–51)

## 2019-12-30 LAB — SARS CORONAVIRUS 2 BY RT PCR (HOSPITAL ORDER, PERFORMED IN ~~LOC~~ HOSPITAL LAB): SARS Coronavirus 2: POSITIVE — AB

## 2019-12-30 MED ORDER — ONDANSETRON HCL 4 MG PO TABS: 4.0000 mg | ORAL_TABLET | Freq: Every day | ORAL | 0 refills | Status: DC | PRN

## 2019-12-30 MED ORDER — SODIUM CHLORIDE 0.9 % IV BOLUS
1000.0000 mL | Freq: Once | INTRAVENOUS | Status: AC
  Administered 2019-12-30: 1000 mL via INTRAVENOUS

## 2019-12-30 MED ORDER — KETOROLAC TROMETHAMINE 30 MG/ML IJ SOLN
30.0000 mg | Freq: Once | INTRAMUSCULAR | Status: AC
  Administered 2019-12-30: 30 mg via INTRAVENOUS
  Filled 2019-12-30: qty 1

## 2019-12-30 MED ORDER — GUAIFENESIN-CODEINE 100-10 MG/5ML PO SOLN: 5.0000 mL | Freq: Four times a day (QID) | ORAL | 0 refills | Status: DC | PRN

## 2019-12-30 MED ORDER — ONDANSETRON HCL 4 MG/2ML IJ SOLN
4.0000 mg | Freq: Once | INTRAMUSCULAR | Status: AC
  Administered 2019-12-30: 4 mg via INTRAVENOUS
  Filled 2019-12-30: qty 2

## 2019-12-30 MED ORDER — KETOROLAC TROMETHAMINE 30 MG/ML IJ SOLN
INTRAMUSCULAR | Status: AC
  Filled 2019-12-30: qty 1

## 2019-12-30 MED ORDER — ACETAMINOPHEN 160 MG/5ML PO SOLN
650.0000 mg | Freq: Once | ORAL | Status: AC
  Administered 2019-12-30: 650 mg via ORAL
  Filled 2019-12-30: qty 20.3
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
## 2019-12-30 NOTE — ED Notes (Signed)
Unable to provide urine sample at this time

## 2019-12-30 NOTE — ED Triage Notes (Addendum)
Reports fever that started last night. Headache and backache. Denies cough, SOB, urinary symptoms or sore throat. Reports lower abdominal pain.   unable to take pills due to being unable to swallow them.

## 2019-12-30 NOTE — Discharge Instructions (Signed)
You have been seen in the emergency department for symptoms consistent with COVID-19.  Your work-up has confirmed COVID-19.  Please stay quarantined in your home for the next 10 days.  The last 3 of those days need to be symptom-free.  Please wear a mask if there are other family members at your home.  Return to the emergency department for any trouble breathing, or any other symptom personally concerning to yourself.  Please take your medications as needed.  You have been provided a note if needed for work.  We are unable to file FMLA paperwork, if you require FMLA please follow-up with your primary care doctor via a televisit if possible.

## 2020-02-29 ENCOUNTER — Telehealth: Payer: Self-pay | Admitting: Family Medicine

## 2020-02-29 NOTE — Telephone Encounter (Signed)
Patient came to office looking for [facility]. Wants to schedule appointment.

## 2020-03-21 ENCOUNTER — Telehealth: Payer: Self-pay | Admitting: Licensed Clinical Social Worker

## 2020-03-21 NOTE — Telephone Encounter (Signed)
Patient left vm for LCSW on 03/19/20. LCSW attempted to return call on 03/21/20.

## 2020-05-07 NOTE — L&D Delivery Note (Signed)
Delivery Note  Toni Black is a [id number] at [redacted]w[redacted]d with an LMP of 04/14/20.  First Stage: Labor onset: 1700 Augmentation: oxytocin and AROM Analgesia /Anesthesia intrapartum: none AROM at 1704  Second Stage: Complete dilation at 1826 Onset of pushing at 1826 FHR second stage category 2  Delivery of a viable baby boy on 01/11/2021  at 1837 by CNM and Dr Feliberto Gottron Delivery of fetal head in OA position with restitution to LOT. x1 nuchal cord loose and reduced;  Anterior then posterior shoulders delivered easily with gentle downward traction. Baby placed on mom's chest, and attended to by baby RN Cord double clamped after cessation of pulsation, cut by Dr. Feliberto Gottron  Cord blood sample collected:yes Collection of cord blood donation :N/A Arterial cord blood sample N/A  Third Stage: Oxytocin bolus started after delivery of infant for hemorrhage prophylaxis  Placenta delivered  intact with 3 VC @ 1848 Placenta disposition: discarded Uterine tone firm with massage / bleeding moderate with clots, patient given 800 mcg of Cytotec.  no laceration identified  Vulvar hematoma identified Anesthesia for repair: General Repair done in OR by MD Est. Blood Loss (mL): 590  Complications: During the second stage of labor, patient  right labia begin to swell and develop a large hematoma. Pressure and was applied while OR team was alerted. Patient was then escorted to the main OR for resection of her right vulvar hematoma  Mom to OR.  Baby to Nursery.  Newborn: Information for the patient's newborn:  Toni, Black [[id number]]  Live born female (Toni Black) Birth Weight: 7 lb 5.1 oz (3320 g) APGAR: 8, 9  Newborn Delivery   Birth date/time: 01/11/2021 18:37:00 Delivery type: Vaginal, Spontaneous        Feeding planned: bottle  ---------- Toni Black, CNM Certified Nurse Midwife [facility]

## 2020-06-09 ENCOUNTER — Ambulatory Visit (LOCAL_COMMUNITY_HEALTH_CENTER): Payer: Self-pay

## 2020-06-09 ENCOUNTER — Other Ambulatory Visit: Payer: Self-pay

## 2020-06-09 VITALS — BP 116/74 | Ht 62.0 in | Wt 166.0 lb

## 2020-06-09 DIAGNOSIS — Z3201 Encounter for pregnancy test, result positive: Secondary | ICD-10-CM

## 2020-06-09 MED ORDER — PRENATAL 27-0.8 MG PO TABS: 1.0000 | ORAL_TABLET | Freq: Every day | ORAL | 0 refills | Status: AC

## 2020-06-09 NOTE — Progress Notes (Signed)
UPT positive. Plans prenatal care at [facility]. Sent to clerk for preadmit. Maxx Pham, RN  

## 2020-06-10 LAB — PREGNANCY, URINE: Preg Test, Ur: POSITIVE — AB

## 2020-07-07 ENCOUNTER — Encounter: Payer: Self-pay | Admitting: Physician Assistant

## 2020-07-07 ENCOUNTER — Other Ambulatory Visit: Payer: Self-pay

## 2020-07-07 ENCOUNTER — Ambulatory Visit: Payer: Medicaid Other | Admitting: Physician Assistant

## 2020-07-07 VITALS — BP 122/76 | HR 105 | Temp 98.8°F | Wt 165.6 lb

## 2020-07-07 DIAGNOSIS — O09899 Supervision of other high risk pregnancies, unspecified trimester: Secondary | ICD-10-CM | POA: Insufficient documentation

## 2020-07-07 DIAGNOSIS — O0990 Supervision of high risk pregnancy, unspecified, unspecified trimester: Secondary | ICD-10-CM

## 2020-07-07 DIAGNOSIS — N879 Dysplasia of cervix uteri, unspecified: Secondary | ICD-10-CM

## 2020-07-07 LAB — URINALYSIS
Bilirubin, UA: NEGATIVE
Glucose, UA: NEGATIVE
Nitrite, UA: NEGATIVE
Protein,UA: NEGATIVE
RBC, UA: NEGATIVE
Specific Gravity, UA: 1.03 (ref 1.005–1.030)
Urobilinogen, Ur: 0.2 mg/dL (ref 0.2–1.0)
pH, UA: 6 (ref 5.0–7.5)

## 2020-07-07 LAB — HEMOGLOBIN, FINGERSTICK: Hemoglobin: 12.4 g/dL (ref 11.1–15.9)
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
## 2020-07-08 LAB — CBC/D/PLT+RPR+RH+ABO+AB SCR
Antibody Screen: NEGATIVE
Basophils Absolute: 0.1 10*3/uL (ref 0.0–0.2)
Basos: 1 %
EOS (ABSOLUTE): 0.3 10*3/uL (ref 0.0–0.4)
Eos: 3 %
Hematocrit: 37.3 % (ref 34.0–46.6)
Hemoglobin: 12.4 g/dL (ref 11.1–15.9)
Hepatitis B Surface Ag: NEGATIVE
Immature Grans (Abs): 0.1 10*3/uL (ref 0.0–0.1)
Immature Granulocytes: 1 %
Lymphocytes Absolute: 1.1 10*3/uL (ref 0.7–3.1)
Lymphs: 10 %
MCH: 27.2 pg (ref 26.6–33.0)
MCHC: 33.2 g/dL (ref 31.5–35.7)
MCV: 82 fL (ref 79–97)
Monocytes Absolute: 0.8 10*3/uL (ref 0.1–0.9)
Monocytes: 7 %
Neutrophils Absolute: 8.9 10*3/uL — ABNORMAL HIGH (ref 1.4–7.0)
Neutrophils: 78 %
Platelets: 310 10*3/uL (ref 150–450)
RBC: 4.56 x10E6/uL (ref 3.77–5.28)
RDW: 14.5 % (ref 11.7–15.4)
RPR Ser Ql: NONREACTIVE
Rh Factor: POSITIVE
WBC: 11.2 10*3/uL — ABNORMAL HIGH (ref 3.4–10.8)

## 2020-07-08 LAB — HIV-1/HIV-2 QUALITATIVE RNA
HIV-1 RNA, Qualitative: NONREACTIVE
HIV-2 RNA, Qualitative: NONREACTIVE

## 2020-07-08 LAB — PROTEIN / CREATININE RATIO, URINE
Creatinine, Urine: 253 mg/dL
Protein, Ur: 19 mg/dL
Protein/Creat Ratio: 75 mg/g creat (ref 0–200)

## 2020-07-08 LAB — HCV AB W REFLEX TO QUANT PCR: HCV Ab: <0.1 s/co ratio (ref 0.0–0.9)

## 2020-07-08 LAB — HCV INTERPRETATION

## 2020-07-08 NOTE — Progress Notes (Signed)
[facility] [place] referral faxed with confirmation received. Jossie Ng, RN

## 2020-07-09 LAB — CHLAMYDIA/GC NAA, CONFIRMATION
Chlamydia trachomatis, NAA: NEGATIVE
Neisseria gonorrhoeae, NAA: NEGATIVE

## 2020-07-11 ENCOUNTER — Telehealth: Payer: Self-pay

## 2020-07-11 ENCOUNTER — Other Ambulatory Visit: Payer: Self-pay | Admitting: Advanced Practice Midwife

## 2020-07-11 ENCOUNTER — Encounter: Payer: Self-pay | Admitting: Advanced Practice Midwife

## 2020-07-11 DIAGNOSIS — O2340 Unspecified infection of urinary tract in pregnancy, unspecified trimester: Secondary | ICD-10-CM | POA: Insufficient documentation

## 2020-07-11 DIAGNOSIS — O99210 Obesity complicating pregnancy, unspecified trimester: Secondary | ICD-10-CM | POA: Insufficient documentation

## 2020-07-11 DIAGNOSIS — O2341 Unspecified infection of urinary tract in pregnancy, first trimester: Secondary | ICD-10-CM

## 2020-07-11 LAB — URINE CULTURE

## 2020-07-11 MED ORDER — NITROFURANTOIN MONOHYD MACRO 100 MG PO CAPS: 100.0000 mg | ORAL_CAPSULE | Freq: Two times a day (BID) | ORAL | 0 refills | Status: AC

## 2020-07-11 NOTE — Telephone Encounter (Signed)
Call to [facility] to obtain US apt for client. Per receptionist, client called practice and [place] scheduled for 07/14/2020 at 1330. Jossie Ng, RN

## 2020-07-12 ENCOUNTER — Telehealth: Payer: Self-pay

## 2020-07-12 ENCOUNTER — Encounter: Payer: Self-pay | Admitting: Physician Assistant

## 2020-07-12 LAB — IGP, APTIMA HPV
HPV Aptima: NEGATIVE
PAP Smear Comment: 0

## 2020-07-12 NOTE — Progress Notes (Signed)
Entered in error

## 2020-07-12 NOTE — Telephone Encounter (Signed)
Call to client to notify her of UTI and need for treatment. Per client, unable to swallow pills. Counseled to ask pharmacist when picks up Macrobid if it can be crushed. Client agrees to do so and to notify RN via phone call if unable to crush medicine. Jossie Ng, RN

## 2020-07-12 NOTE — Progress Notes (Signed)
Presents for MH IP. Takes PNV. Denies Ed/Hospital visit since + PT. Declines flu, covid vaccines. Sharlyne Pacas, RN

## 2020-07-12 NOTE — Telephone Encounter (Signed)
Call to client to let her know E. Sciora CNM tried to prescribe liquid Macrobid but cost is $2000. Per Ms. Sciora, Macrobid is a capsule and client can open and sprinkle on food. Per recorded message, voicemail box is full. Jossie Ng, RN

## 2020-07-14 ENCOUNTER — Telehealth: Payer: Self-pay

## 2020-07-14 NOTE — Telephone Encounter (Signed)
Call received from Toni Black, Toni Black. Per Toni Black, Toni Black measures 13 weeks (04/2021 EDD). Estimated fundal height = 18 cm. Per Toni Black, fundal height will not be part of Toni Black report, but she estimated due to fundal height at time of [facility] IP appt written on Toni Black referral. Reports fundal height ~ 2 cm below umbilicus. She states Toni Black will read report and copy will be sent to Toni Black. Toni Black states did not see any obvious abnormalities on Toni Black. Toni Ng, RN

## 2020-07-25 ENCOUNTER — Encounter: Payer: Self-pay | Admitting: Advanced Practice Midwife

## 2020-08-04 ENCOUNTER — Ambulatory Visit: Payer: Medicaid Other | Admitting: Family Medicine

## 2020-08-04 ENCOUNTER — Other Ambulatory Visit: Payer: Self-pay

## 2020-08-04 VITALS — BP 114/63 | HR 116 | Temp 99.3°F | Wt 167.2 lb

## 2020-08-04 DIAGNOSIS — O99210 Obesity complicating pregnancy, unspecified trimester: Secondary | ICD-10-CM

## 2020-08-04 DIAGNOSIS — O09899 Supervision of other high risk pregnancies, unspecified trimester: Secondary | ICD-10-CM

## 2020-08-04 DIAGNOSIS — O2340 Unspecified infection of urinary tract in pregnancy, unspecified trimester: Secondary | ICD-10-CM
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
## 2020-08-04 NOTE — Progress Notes (Signed)
[facility] U/S referral faxed with confirmation. Unable to call [facility] today to schedule due to late hour.Burt Knack, RN

## 2020-08-04 NOTE — Progress Notes (Signed)
Patient here for MH RV at 16 weeks. Quad screen declined, declination signed. Patient states not taking ASA, doesn't remember that being discussed at last visit. Finished UTI meds, needs urine culture TOC today.Burt Knack, RN

## 2020-08-07 LAB — URINE CULTURE

## 2020-08-08 ENCOUNTER — Telehealth: Payer: Self-pay

## 2020-08-08 NOTE — Telephone Encounter (Signed)
Call to [facility] to ascertain if anatomy US has been scheduled. Per receptionist, sure referral has been received but appt not yet scheduled. Referral faxed 08/04/2020 with confirmation received. Jossie Ng, RN

## 2020-08-10 ENCOUNTER — Telehealth: Payer: Self-pay

## 2020-08-10 NOTE — Telephone Encounter (Signed)
Call to [facility] to ascertain if anatomy US scheduled. Per receptionist, [place] not yet scheduled (EGA = 16 5/7). Jossie Ng, RN

## 2020-08-17 ENCOUNTER — Telehealth: Payer: Self-pay

## 2020-08-17 NOTE — Telephone Encounter (Signed)
Call to [facility] to ascertain if anatomy US scheduled (referral faxed with confirmation 08/04/2020). Per Adelina Mings, not yet scheduled as a sonographer left practice and one going on vacation soon. Per Adelina Mings, she will try to get [place] scheduled in next few days. Jossie Ng, RN

## 2020-08-23 ENCOUNTER — Telehealth: Payer: Self-pay

## 2020-08-23 NOTE — Telephone Encounter (Signed)
Call to [facility] to ascertain if anatomy US appt scheduled as referral faxed on 08/04/2020. Per receptionist, appt has been scheduled for 09/15/2020 at 2:45 pm and client should be aware as appt scheduled. Call to client and left message with appt date / time and to call [facility] if has questions.Jossie Ng, RN

## 2020-09-01 ENCOUNTER — Other Ambulatory Visit: Payer: Self-pay

## 2020-09-01 ENCOUNTER — Ambulatory Visit: Payer: Medicaid Other | Admitting: Family Medicine

## 2020-09-01 ENCOUNTER — Ambulatory Visit: Payer: Medicaid Other

## 2020-09-01 DIAGNOSIS — O0990 Supervision of high risk pregnancy, unspecified, unspecified trimester: Secondary | ICD-10-CM

## 2020-09-01 DIAGNOSIS — O0992 Supervision of high risk pregnancy, unspecified, second trimester: Secondary | ICD-10-CM
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
## 2020-09-01 NOTE — Progress Notes (Signed)
Pt denies visits to ER since last visit at [facility]. Taking PNV. Pt states she has been having lower belly pains for approximately 2 weeks, last for about 20 minutes, rates pain at about an 8 on a 1-10 scale; currently "having small pains."

## 2020-09-29 ENCOUNTER — Ambulatory Visit: Payer: Medicaid Other | Admitting: Physician Assistant

## 2020-09-29 ENCOUNTER — Encounter: Payer: Self-pay | Admitting: Physician Assistant

## 2020-09-29 ENCOUNTER — Other Ambulatory Visit: Payer: Self-pay

## 2020-09-29 VITALS — BP 116/65 | HR 78 | Temp 98.7°F | Wt 166.6 lb

## 2020-09-29 DIAGNOSIS — O0992 Supervision of high risk pregnancy, unspecified, second trimester: Secondary | ICD-10-CM | POA: Diagnosis not present

## 2020-09-29 DIAGNOSIS — F4323 Adjustment disorder with mixed anxiety and depressed mood: Secondary | ICD-10-CM

## 2020-09-29 DIAGNOSIS — O0990 Supervision of high risk pregnancy, unspecified, unspecified trimester: Secondary | ICD-10-CM

## 2020-09-29 DIAGNOSIS — O09899 Supervision of other high risk pregnancies, unspecified trimester: Secondary | ICD-10-CM

## 2020-09-29 MED ORDER — PRENATAL MULTIVITAMIN CH: 1.0000 | ORAL_TABLET | Freq: Every day | ORAL | 0 refills | Status: DC
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
## 2020-10-19 ENCOUNTER — Other Ambulatory Visit: Payer: Self-pay

## 2020-10-19 ENCOUNTER — Encounter: Payer: Self-pay | Admitting: Obstetrics and Gynecology

## 2020-10-19 ENCOUNTER — Observation Stay
Admission: EM | Admit: 2020-10-19 | Discharge: 2020-10-19 | Disposition: A | Discharge status: Home / Self Care | Payer: Medicaid Other | Attending: Obstetrics and Gynecology | Admitting: Obstetrics and Gynecology

## 2020-10-19 DIAGNOSIS — R102 Pelvic and perineal pain: Secondary | ICD-10-CM | POA: Insufficient documentation

## 2020-10-19 DIAGNOSIS — O26892 Other specified pregnancy related conditions, second trimester: Principal | ICD-10-CM | POA: Insufficient documentation

## 2020-10-19 DIAGNOSIS — Z3A26 26 weeks gestation of pregnancy: Secondary | ICD-10-CM | POA: Insufficient documentation

## 2020-10-19 DIAGNOSIS — N898 Other specified noninflammatory disorders of vagina: Secondary | ICD-10-CM | POA: Diagnosis not present

## 2020-10-19 DIAGNOSIS — O26893 Other specified pregnancy related conditions, third trimester: Secondary | ICD-10-CM | POA: Diagnosis present

## 2020-10-19 LAB — URINALYSIS, ROUTINE W REFLEX MICROSCOPIC
Bilirubin Urine: NEGATIVE
Glucose, UA: NEGATIVE mg/dL
Hgb urine dipstick: NEGATIVE
Ketones, ur: NEGATIVE mg/dL
Leukocytes,Ua: NEGATIVE
Nitrite: NEGATIVE
Protein, ur: NEGATIVE mg/dL
Specific Gravity, Urine: 1.001 — ABNORMAL LOW (ref 1.005–1.030)
pH: 6 (ref 5.0–8.0)

## 2020-10-19 LAB — WET PREP, GENITAL
Clue Cells Wet Prep HPF POC: NONE SEEN
Sperm: NONE SEEN
Trich, Wet Prep: NONE SEEN
WBC, Wet Prep HPF POC: NONE SEEN
Yeast Wet Prep HPF POC: NONE SEEN

## 2020-10-19 LAB — CHLAMYDIA/NGC RT PCR (ARMC ONLY)
Chlamydia Tr: NOT DETECTED
N gonorrhoeae: NOT DETECTED

## 2020-10-19 MED ORDER — ACETAMINOPHEN 325 MG PO TABS: 650.0000 mg | ORAL_TABLET | ORAL | Status: DC | PRN

## 2020-10-19 NOTE — OB Triage Note (Signed)
Pt is a 35yo G6P5 at [redacted]w[redacted]d that presents via EMS with complaints of lower abdominal cramping on and off that started Sunday. Pt states the pain is worse when she walks. Pt states she has not had any VB, LOF but states she has some thick white discharge the last few days. Pt states she has had early deliveries around 36 and 37 weeks. EFM applied and initial FHT 145.
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
## 2020-10-19 NOTE — Progress Notes (Signed)
Pt discharged home per order.  Pt stable and ambulatory. An After Visit Summary was printed and given to the patient. Discharge education completed with patient/family including follow up instructions, medication list, d/c activities. Pt received labor and bleeding precautions. Patient able to verbalize understanding, all questions fully answered. Patient instructed to return to ED, call 911, or call MD for any changes in condition. 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 Pt discharged from L&D and transported via UBER consent signed by pt.

## 2020-10-19 NOTE — Discharge Summary (Signed)
Toni Black is a 35 y.o. female. She is at [redacted]w[redacted]d gestation. Patient's last menstrual period was 04/14/2020 (exact date). Estimated Date of Delivery: 01/19/21  Prenatal care site: [facility]  Current pregnancy complicated by:  History abnormal Paps, last WNL 07/2020 Hx preterm delivery 36wks with G1, 37-39wks with following 4 deliveries, declined Makena Obesity, BMI 30.1 pre-preg.   Chief complaint: presents via EMS with complaints of lower abdominal cramping on and off that started Sunday. Pt states the pain is worse when she walks. Pt states she has not had any VB, LOF but states she has some thick white discharge the last few days   S: Resting comfortably. no CTX, no VB.no LOF,  Active fetal movement. denies: HA, visual changes, SOB, or RUQ/epigastric pain  Maternal Medical History:   Past Medical History:  Diagnosis Date   Anemia    Infection    History of bladder infections   Mental disorder    Pregnancy induced hypertension    5th pregnancy, per patient   Preterm labor    Teeth problem    Per patient, "bad teeth"   Vaginal Pap smear, abnormal     Past Surgical History:  Procedure Laterality Date   Denies surgical history     NO PAST SURGERIES      No Known Allergies  Prior to Admission medications   Medication Sig Start Date End Date Taking? Authorizing Provider  Prenatal Vit-Fe Fumarate-FA (PRENATAL MULTIVITAMIN) TABS tablet Take 1 tablet by mouth daily at 12 noon. 09/29/20  Yes Streilein, Mathis Dad, PA-C      Social History: She  reports that she has never smoked. She has never used smokeless tobacco. She reports that she does not drink alcohol and does not use drugs.  Family History: family history includes Arthritis in her maternal aunt; Asthma in her son; Diabetes in her maternal aunt; Fibroids in her mother; Hypertension in her father and mother; Leukemia in her maternal grandmother; Migraines in her father and mother; Throat cancer in her maternal grandfather.    Review of Systems: A full review of systems was performed and negative except as noted in the HPI.     O:  BP 136/85 (BP Location: Left Arm)   Pulse (!) 129   Temp 99.1 F (37.3 C) (Oral)   Resp 16   Ht 5\' 1"  (1.549 m)   Wt 76.2 kg   LMP 04/14/2020 (Exact Date)   BMI 31.74 kg/m  Results for orders placed or performed during the hospital encounter of 10/19/20 (from the past 48 hour(s))  Wet prep, genital   Collection Time: 10/19/20 11:48 AM   Specimen: Vaginal  Result Value Ref Range   Yeast Wet Prep HPF POC NONE SEEN NONE SEEN   Trich, Wet Prep NONE SEEN NONE SEEN   Clue Cells Wet Prep HPF POC NONE SEEN NONE SEEN   WBC, Wet Prep HPF POC NONE SEEN NONE SEEN   Sperm NONE SEEN   Urinalysis, Routine w reflex microscopic Vaginal   Collection Time: 10/19/20 11:48 AM  Result Value Ref Range   Color, Urine STRAW (A) YELLOW   APPearance HAZY (A) CLEAR   Specific Gravity, Urine 1.001 (L) 1.005 - 1.030   pH 6.0 5.0 - 8.0   Glucose, UA NEGATIVE NEGATIVE mg/dL   Hgb urine dipstick NEGATIVE NEGATIVE   Bilirubin Urine NEGATIVE NEGATIVE   Ketones, ur NEGATIVE NEGATIVE mg/dL   Protein, ur NEGATIVE NEGATIVE mg/dL   Nitrite NEGATIVE NEGATIVE   Leukocytes,Ua NEGATIVE NEGATIVE  Constitutional: NAD, AAOx3  HE/ENT: extraocular movements grossly intact, moist mucous membranes CV: RRR PULM: nl respiratory effort, CTABL     Abd: gravid, non-tender, non-distended, soft      Ext: Non-tender, Nonedematous   Psych: mood appropriate, speech normal Pelvic: deferred  Fetal  monitoring: Cat I Appropriate for GA Baseline: 145 Variability: moderate Accelerations: present x >2 Decelerations absent  NO UCs on TOCO    A/P: 35 y.o. [redacted]w[redacted]d here for antenatal surveillance for abdominal pain and vaginal discharge  Principle Diagnosis: no vaginal infection, no contractions, pain c/w ligament pains.   Preterm labor: none Fetal Wellbeing: Reassuring Cat 1 tracing, Reactive NST Reviewed  comfort measures for ligament pain, encouraged support belt, given work restrictions to be able to sit down more at work.   D/c home stable, precautions reviewed, follow-up as scheduled.    Randa Ngo, CNM 10/19/2020  1:21 PM

## 2020-10-20 ENCOUNTER — Ambulatory Visit: Payer: Medicaid Other | Admitting: Advanced Practice Midwife

## 2020-10-20 VITALS — BP 112/69 | HR 103 | Temp 98.2°F | Wt 164.4 lb

## 2020-10-20 DIAGNOSIS — O99213 Obesity complicating pregnancy, third trimester: Secondary | ICD-10-CM

## 2020-10-20 DIAGNOSIS — Z23 Encounter for immunization: Secondary | ICD-10-CM | POA: Diagnosis not present

## 2020-10-20 DIAGNOSIS — O99210 Obesity complicating pregnancy, unspecified trimester: Secondary | ICD-10-CM

## 2020-10-20 DIAGNOSIS — O99019 Anemia complicating pregnancy, unspecified trimester: Secondary | ICD-10-CM | POA: Insufficient documentation

## 2020-10-20 DIAGNOSIS — O09899 Supervision of other high risk pregnancies, unspecified trimester: Secondary | ICD-10-CM

## 2020-10-20 DIAGNOSIS — O99013 Anemia complicating pregnancy, third trimester: Secondary | ICD-10-CM

## 2020-10-20 DIAGNOSIS — Z91410 Personal history of adult physical and sexual abuse: Secondary | ICD-10-CM

## 2020-10-20 DIAGNOSIS — O0993 Supervision of high risk pregnancy, unspecified, third trimester: Secondary | ICD-10-CM | POA: Diagnosis not present

## 2020-10-20 DIAGNOSIS — O0990 Supervision of high risk pregnancy, unspecified, unspecified trimester: Secondary | ICD-10-CM

## 2020-10-20 LAB — HEMOGLOBIN, FINGERSTICK: Hemoglobin: 10.2 g/dL — ABNORMAL LOW (ref 11.1–15.9)

## 2020-10-20 MED ORDER — FERROUS SULFATE 325 (65 FE) MG PO TABS: 325.0000 mg | ORAL_TABLET | Freq: Every day | ORAL | 3 refills | Status: DC

## 2020-10-20 NOTE — Progress Notes (Signed)
Per standing order: Dispensed ferrous sulfate 325mg and given to patient.   Instructed to take one tablet daily with juice that has Vit C such as orange, apple, or grape juice.   Anemia panel order added.   Anemia in pregnancy added to problem list.   Counsel patient on Fe-rich foods and Anemia in Pregnancy Pamphlet given.   Patient verbalized understanding. Questions answered.    Elisea Khader, RN 
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
## 2020-10-20 NOTE — Addendum Note (Signed)
Addended by: Floy Sabina on: 10/20/2020 04:55 PM   Modules accepted: Orders

## 2020-10-21 LAB — FE+CBC/D/PLT+TIBC+FER+RETIC
Basophils Absolute: 0 10*3/uL (ref 0.0–0.2)
Basos: 0 %
EOS (ABSOLUTE): 0.2 10*3/uL (ref 0.0–0.4)
Eos: 2 %
Ferritin: 10 ng/mL — ABNORMAL LOW (ref 15–150)
Hematocrit: 31 % — ABNORMAL LOW (ref 34.0–46.6)
Hemoglobin: 10.1 g/dL — ABNORMAL LOW (ref 11.1–15.9)
Immature Grans (Abs): 0.1 10*3/uL (ref 0.0–0.1)
Immature Granulocytes: 1 %
Iron Saturation: 19 % (ref 15–55)
Iron: 81 ug/dL (ref 27–159)
Lymphocytes Absolute: 0.5 10*3/uL — ABNORMAL LOW (ref 0.7–3.1)
Lymphs: 6 %
MCH: 25.6 pg — ABNORMAL LOW (ref 26.6–33.0)
MCHC: 32.6 g/dL (ref 31.5–35.7)
MCV: 79 fL (ref 79–97)
Monocytes Absolute: 0.8 10*3/uL (ref 0.1–0.9)
Monocytes: 9 %
Neutrophils Absolute: 7.3 10*3/uL — ABNORMAL HIGH (ref 1.4–7.0)
Neutrophils: 82 %
Platelets: 275 10*3/uL (ref 150–450)
RBC: 3.94 x10E6/uL (ref 3.77–5.28)
RDW: 12.8 % (ref 11.7–15.4)
Retic Ct Pct: 1.7 % (ref 0.6–2.6)
Total Iron Binding Capacity: 435 ug/dL (ref 250–450)
UIBC: 354 ug/dL (ref 131–425)
WBC: 8.9 10*3/uL (ref 3.4–10.8)

## 2020-10-21 LAB — GLUCOSE, 1 HOUR GESTATIONAL: Gestational Diabetes Screen: 92 mg/dL (ref 65–139)

## 2020-10-21 LAB — HIV-1/HIV-2 QUALITATIVE RNA
HIV-1 RNA, Qualitative: NONREACTIVE
HIV-2 RNA, Qualitative: NONREACTIVE

## 2020-10-21 LAB — RPR: RPR Ser Ql: NONREACTIVE

## 2020-10-31 NOTE — Addendum Note (Signed)
Addended by: Heywood Bene on: 10/31/2020 03:49 PM   Modules accepted: Orders

## 2020-11-03 ENCOUNTER — Other Ambulatory Visit: Payer: Self-pay

## 2020-11-03 ENCOUNTER — Ambulatory Visit: Payer: Medicaid Other | Admitting: Physician Assistant

## 2020-11-03 ENCOUNTER — Encounter: Payer: Self-pay | Admitting: Physician Assistant

## 2020-11-03 VITALS — BP 102/66 | HR 106 | Temp 97.6°F | Wt 163.8 lb

## 2020-11-03 DIAGNOSIS — O0990 Supervision of high risk pregnancy, unspecified, unspecified trimester: Secondary | ICD-10-CM

## 2020-11-03 DIAGNOSIS — O09899 Supervision of other high risk pregnancies, unspecified trimester: Secondary | ICD-10-CM

## 2020-11-03 DIAGNOSIS — O99013 Anemia complicating pregnancy, third trimester: Secondary | ICD-10-CM

## 2020-11-03 NOTE — Progress Notes (Signed)
Patient here for MH RV at 29 weeks. Burt Knack, RN
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
## 2020-11-17 ENCOUNTER — Other Ambulatory Visit: Payer: Self-pay

## 2020-11-17 ENCOUNTER — Encounter: Payer: Self-pay | Admitting: Physician Assistant

## 2020-11-17 ENCOUNTER — Ambulatory Visit: Payer: Medicaid Other | Admitting: Physician Assistant

## 2020-11-17 VITALS — BP 106/67 | HR 96 | Temp 98.0°F | Wt 163.8 lb

## 2020-11-17 DIAGNOSIS — O09899 Supervision of other high risk pregnancies, unspecified trimester: Secondary | ICD-10-CM

## 2020-11-17 DIAGNOSIS — O99013 Anemia complicating pregnancy, third trimester: Secondary | ICD-10-CM

## 2020-11-17 DIAGNOSIS — O0990 Supervision of high risk pregnancy, unspecified, unspecified trimester: Secondary | ICD-10-CM
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
## 2020-11-17 NOTE — Progress Notes (Signed)
Hgb WNL. Reviewed with provider during clinic visit.   Floy Sabina, RN

## 2020-11-18 ENCOUNTER — Telehealth: Payer: Self-pay

## 2020-11-18 LAB — HEMOGLOBIN, FINGERSTICK: Hemoglobin: 11.1 g/dL (ref 11.1–15.9)

## 2020-11-18 NOTE — Telephone Encounter (Signed)
Client retrieved work papers from [facility]. Refer to previous phone note for details. Jossie Ng, RN

## 2020-11-18 NOTE — Telephone Encounter (Signed)
Client dropped off work papers with clerical who thought were FMLA papers. When RN reviewed papers, realized client brought a return to work form that will require provider completion. However, client is ~ [redacted] weeks pregnant and has not yet delivered. Client called to pick up these papers and bring maternity leave paperwork. Per client, she will check with her company regarding maternity leave forms. Jossie Ng, RN

## 2020-12-01 ENCOUNTER — Other Ambulatory Visit: Payer: Self-pay

## 2020-12-01 ENCOUNTER — Ambulatory Visit: Payer: Medicaid Other | Admitting: Physician Assistant

## 2020-12-01 ENCOUNTER — Encounter: Payer: Self-pay | Admitting: Physician Assistant

## 2020-12-01 VITALS — BP 116/76 | HR 94 | Temp 97.6°F | Wt 164.8 lb

## 2020-12-01 DIAGNOSIS — O99013 Anemia complicating pregnancy, third trimester: Secondary | ICD-10-CM

## 2020-12-01 DIAGNOSIS — O09899 Supervision of other high risk pregnancies, unspecified trimester: Secondary | ICD-10-CM

## 2020-12-01 DIAGNOSIS — O0990 Supervision of high risk pregnancy, unspecified, unspecified trimester: Secondary | ICD-10-CM

## 2020-12-01 DIAGNOSIS — O99210 Obesity complicating pregnancy, unspecified trimester: Secondary | ICD-10-CM

## 2020-12-01 DIAGNOSIS — O26849 Uterine size-date discrepancy, unspecified trimester: Secondary | ICD-10-CM | POA: Insufficient documentation

## 2020-12-01 DIAGNOSIS — O26843 Uterine size-date discrepancy, third trimester: Secondary | ICD-10-CM

## 2020-12-01 DIAGNOSIS — O99213 Obesity complicating pregnancy, third trimester: Secondary | ICD-10-CM

## 2020-12-01 DIAGNOSIS — F4323 Adjustment disorder with mixed anxiety and depressed mood: Secondary | ICD-10-CM

## 2020-12-01 DIAGNOSIS — O0993 Supervision of high risk pregnancy, unspecified, third trimester: Secondary | ICD-10-CM

## 2020-12-01 NOTE — Progress Notes (Signed)
Patient here for MH RV at 33 weeks. Taking iron daily with juice.Burt Knack, RN
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
## 2020-12-05 ENCOUNTER — Telehealth: Payer: Self-pay

## 2020-12-05 NOTE — Telephone Encounter (Signed)
Call to [facility] to ascertain if 12/01/2020 ordered US scheduled. Per receptionist, it it not yet scheduled. Jossie Ng, RN

## 2020-12-12 ENCOUNTER — Telehealth: Payer: Self-pay

## 2020-12-12 NOTE — Telephone Encounter (Signed)
TC to patient to inform of [facility] U/S on 12/15/2020 at 11:00. Patient counseled to arrive at 10:45. Patient states understanding.Burt Knack, RN

## 2020-12-15 ENCOUNTER — Telehealth: Payer: Self-pay

## 2020-12-15 ENCOUNTER — Ambulatory Visit: Payer: Medicaid Other | Admitting: Family Medicine

## 2020-12-15 ENCOUNTER — Other Ambulatory Visit: Payer: Self-pay

## 2020-12-15 VITALS — BP 114/74 | HR 92 | Temp 98.1°F | Wt 166.4 lb

## 2020-12-15 DIAGNOSIS — O0990 Supervision of high risk pregnancy, unspecified, unspecified trimester: Secondary | ICD-10-CM

## 2020-12-15 DIAGNOSIS — O0993 Supervision of high risk pregnancy, unspecified, third trimester: Secondary | ICD-10-CM

## 2020-12-15 DIAGNOSIS — O99210 Obesity complicating pregnancy, unspecified trimester: Secondary | ICD-10-CM

## 2020-12-15 DIAGNOSIS — O26843 Uterine size-date discrepancy, third trimester: Secondary | ICD-10-CM

## 2020-12-15 DIAGNOSIS — O26849 Uterine size-date discrepancy, unspecified trimester: Secondary | ICD-10-CM

## 2020-12-15 DIAGNOSIS — O99213 Obesity complicating pregnancy, third trimester: Secondary | ICD-10-CM

## 2020-12-15 DIAGNOSIS — O09899 Supervision of other high risk pregnancies, unspecified trimester: Secondary | ICD-10-CM

## 2020-12-15 NOTE — Progress Notes (Signed)
Patient arrived early today for MH RV at 35 weeks. CMHRP and PHQ9 done today. Had [facility] U/S this morning.Burt Knack, RN
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
## 2020-12-21 ENCOUNTER — Ambulatory Visit: Payer: Medicaid Other | Admitting: Advanced Practice Midwife

## 2020-12-21 ENCOUNTER — Other Ambulatory Visit: Payer: Self-pay

## 2020-12-21 VITALS — BP 126/80 | HR 94 | Temp 97.7°F | Wt 167.0 lb

## 2020-12-21 DIAGNOSIS — O0993 Supervision of high risk pregnancy, unspecified, third trimester: Secondary | ICD-10-CM

## 2020-12-21 DIAGNOSIS — O99013 Anemia complicating pregnancy, third trimester: Secondary | ICD-10-CM

## 2020-12-21 DIAGNOSIS — R87612 Low grade squamous intraepithelial lesion on cytologic smear of cervix (LGSIL): Secondary | ICD-10-CM

## 2020-12-21 DIAGNOSIS — O99210 Obesity complicating pregnancy, unspecified trimester: Secondary | ICD-10-CM

## 2020-12-21 DIAGNOSIS — O0990 Supervision of high risk pregnancy, unspecified, unspecified trimester: Secondary | ICD-10-CM

## 2020-12-21 DIAGNOSIS — O99212 Obesity complicating pregnancy, second trimester: Secondary | ICD-10-CM

## 2020-12-21 LAB — URINALYSIS
Bilirubin, UA: NEGATIVE
Glucose, UA: NEGATIVE
Nitrite, UA: NEGATIVE
Protein,UA: NEGATIVE
RBC, UA: NEGATIVE
Specific Gravity, UA: 1.025 (ref 1.005–1.030)
Urobilinogen, Ur: 1 mg/dL (ref 0.2–1.0)
pH, UA: 6 (ref 5.0–7.5)

## 2020-12-21 LAB — HEMOGLOBIN, FINGERSTICK: Hemoglobin: 9.8 g/dL — ABNORMAL LOW (ref 11.1–15.9)

## 2020-12-21 NOTE — Progress Notes (Addendum)
Here today for 35.6 week MH RV. Taking PNV and Iron QD. Denies ED/hospital visits since last RV. Hgb recheck today. Tawny Hopping, RN
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
## 2020-12-21 NOTE — Progress Notes (Signed)
Hgb reviewed during clinic visit.   Encouraged patient to take iron BID with meals and vit c juice. Anemia in pregnancy pamphlet given. All questions answered.   Floy Sabina, RN

## 2020-12-27 ENCOUNTER — Telehealth: Payer: Self-pay

## 2020-12-27 NOTE — Telephone Encounter (Signed)
Patient came into [facility] asking for nurse advice. Patient has no appointment but appointment scheduled tomorrow 12/28/20 at 3:45.   Patient is c/o vomiting her meals yesterday, pelvic pressure, and contractions (unable to specify how often) but contractions began yesterday during the evening. Patient states she is hydrating plenty however unable to specify how much water. Recommended patient to take OTC Unisom at night time 1 tablet and to visit L&D for triage as they can monitor contractions and give fluids IV if needed.   Patient shares 1 vehicle with her significant other so she is not sure exactly when she can go to [facility] but states she will see if she can leave work before 4pm and get evaluated.   Floy Sabina, RN

## 2020-12-28 ENCOUNTER — Observation Stay
Admission: EM | Admit: 2020-12-28 | Discharge: 2020-12-28 | Disposition: A | Discharge status: Home / Self Care | Payer: Medicaid Other | Attending: Obstetrics and Gynecology | Admitting: Obstetrics and Gynecology

## 2020-12-28 ENCOUNTER — Other Ambulatory Visit: Payer: Self-pay

## 2020-12-28 ENCOUNTER — Ambulatory Visit: Payer: Medicaid Other | Admitting: Advanced Practice Midwife

## 2020-12-28 ENCOUNTER — Encounter: Payer: Self-pay | Admitting: Obstetrics and Gynecology

## 2020-12-28 VITALS — BP 132/84 | HR 103 | Temp 98.6°F | Wt 168.0 lb

## 2020-12-28 DIAGNOSIS — Z3A37 37 weeks gestation of pregnancy: Secondary | ICD-10-CM | POA: Insufficient documentation

## 2020-12-28 DIAGNOSIS — O09899 Supervision of other high risk pregnancies, unspecified trimester: Secondary | ICD-10-CM

## 2020-12-28 DIAGNOSIS — O26849 Uterine size-date discrepancy, unspecified trimester: Secondary | ICD-10-CM

## 2020-12-28 DIAGNOSIS — O99210 Obesity complicating pregnancy, unspecified trimester: Secondary | ICD-10-CM

## 2020-12-28 DIAGNOSIS — Z3490 Encounter for supervision of normal pregnancy, unspecified, unspecified trimester: Secondary | ICD-10-CM

## 2020-12-28 DIAGNOSIS — Z20822 Contact with and (suspected) exposure to covid-19: Secondary | ICD-10-CM | POA: Diagnosis not present

## 2020-12-28 DIAGNOSIS — O0990 Supervision of high risk pregnancy, unspecified, unspecified trimester: Secondary | ICD-10-CM

## 2020-12-28 DIAGNOSIS — O99013 Anemia complicating pregnancy, third trimester: Secondary | ICD-10-CM

## 2020-12-28 LAB — RESP PANEL BY RT-PCR (FLU A&B, COVID) ARPGX2
Influenza A by PCR: NEGATIVE
Influenza B by PCR: NEGATIVE
SARS Coronavirus 2 by RT PCR: NEGATIVE

## 2020-12-28 LAB — GROUP B STREP BY PCR: Group B strep by PCR: POSITIVE — AB

## 2020-12-28 NOTE — OB Triage Note (Signed)
Pt discharged home in stable condition. RN provided discharge instructions to pt, including information regarding labor precautions. Pt verbalized understanding and all questions answered at this time.

## 2020-12-28 NOTE — OB Triage Note (Signed)
[facility] with complaints of ctx. Pt is a G6P5, [redacted]w[redacted]d. Pt rates ctx pain 5/10 and says are inconsistent. Pt states was told by provider to come and be seen. Pt denies LOF, vaginal bleeding. Pt states positive FM.

## 2020-12-28 NOTE — Progress Notes (Signed)
Patient here for MH RV at 36 6/7. States she did not go to the hospital yesterday, was vomiting, having lower abdominal pain, contractions and discharge. Wants to be checked today. 36 week packet given, 36 week labs today, patient prefers provider to collect.Burt Knack, RN

## 2020-12-28 NOTE — Discharge Instructions (Signed)
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
## 2020-12-29 ENCOUNTER — Encounter: Payer: Self-pay | Admitting: Obstetrics and Gynecology

## 2020-12-29 ENCOUNTER — Observation Stay
Admission: EM | Admit: 2020-12-29 | Discharge: 2020-12-29 | Disposition: A | Discharge status: Home / Self Care | Payer: Medicaid Other

## 2020-12-29 DIAGNOSIS — E669 Obesity, unspecified: Secondary | ICD-10-CM | POA: Insufficient documentation

## 2020-12-29 DIAGNOSIS — Z3A37 37 weeks gestation of pregnancy: Secondary | ICD-10-CM | POA: Insufficient documentation

## 2020-12-29 DIAGNOSIS — O99013 Anemia complicating pregnancy, third trimester: Secondary | ICD-10-CM

## 2020-12-29 DIAGNOSIS — O26849 Uterine size-date discrepancy, unspecified trimester: Secondary | ICD-10-CM

## 2020-12-29 DIAGNOSIS — O133 Gestational [pregnancy-induced] hypertension without significant proteinuria, third trimester: Secondary | ICD-10-CM | POA: Insufficient documentation

## 2020-12-29 DIAGNOSIS — O99213 Obesity complicating pregnancy, third trimester: Secondary | ICD-10-CM | POA: Insufficient documentation

## 2020-12-29 DIAGNOSIS — O471 False labor at or after 37 completed weeks of gestation: Principal | ICD-10-CM | POA: Insufficient documentation

## 2020-12-29 DIAGNOSIS — O479 False labor, unspecified: Secondary | ICD-10-CM | POA: Diagnosis present

## 2020-12-29 DIAGNOSIS — Z8751 Personal history of pre-term labor: Secondary | ICD-10-CM | POA: Diagnosis not present

## 2020-12-29 LAB — WET PREP FOR TRICH, YEAST, CLUE
Clue Cell Exam: NEGATIVE
Trichomonas Exam: NEGATIVE
Yeast Exam: NEGATIVE

## 2020-12-29 MED ORDER — ACETAMINOPHEN 500 MG PO TABS: 1000.0000 mg | ORAL_TABLET | Freq: Four times a day (QID) | ORAL | Status: DC | PRN

## 2020-12-29 MED ORDER — CALCIUM CARBONATE ANTACID 500 MG PO CHEW: 2.0000 | CHEWABLE_TABLET | ORAL | Status: DC | PRN

## 2020-12-29 NOTE — Discharge Summary (Signed)
Toni Black is a 35 y.o. female. She is at [redacted]w[redacted]d gestation. Patient's last menstrual period was 04/14/2020 (exact date). Estimated Date of Delivery: 01/19/21  Prenatal care site: [facility]  Chief complaint: contractions   HPI: Toni Black presents to L&D with complaints of contractions.  She has been having irregular contractions since yesterday.  She was seen last night in L&D and was discharged home after no cervical change over several hours.  She continues to report irregular contractions.  Denies LOF or vaginal bleeding, endorses good fetal movement.   Factors complicating pregnancy: Abnormal pap smear  Grand multiparity  Obesity in pregnancy  Uterine Size/Dates discrepancy - EFW 48% History of IPV   S: Resting comfortably. Irreg contractions, no VB.no LOF,  Active fetal movement.   Maternal Medical History:  Past Medical Hx:  has a past medical history of Anemia, Anemia, borderline (04/15/2018), Infection, Mental disorder, Pregnancy induced hypertension, Preterm labor, Teeth problem, and Vaginal Pap smear, abnormal.    Past Surgical Hx:  has a past surgical history that includes No past surgeries and Denies surgical history.   No Known Allergies   Prior to Admission medications   Medication Sig Start Date End Date Taking? Authorizing Provider  ferrous sulfate 325 (65 FE) MG tablet Take 1 tablet (325 mg total) by mouth daily with breakfast. 10/20/20   Federico Flake, MD  Prenatal Vit-Fe Fumarate-FA (PRENATAL MULTIVITAMIN) TABS tablet Take 1 tablet by mouth daily at 12 noon. 09/29/20   Landry Dyke, PA-C    Social History: She  reports that she has never smoked. She has never used smokeless tobacco. She reports that she does not drink alcohol and does not use drugs.  Family History: family history includes Arthritis in her maternal aunt; Asthma in her son; Diabetes in her maternal aunt; Fibroids in her mother; Hypertension in her father and mother; Leukemia in her  maternal grandmother; Migraines in her father and mother; Throat cancer in her maternal grandfather.   Review of Systems: A full review of systems was performed and negative except as noted in the HPI.    O:  BP 120/85   Pulse 95   Temp 98.3 F (36.8 C) (Oral)   Resp 18   Ht 5\' 2"  (1.575 m)   Wt 74.4 kg   LMP 04/14/2020 (Exact Date)   BMI 30.00 kg/m  Results for orders placed or performed during the hospital encounter of 12/28/20 (from the past 48 hour(s))  Group B strep by PCR   Collection Time: 12/28/20  7:56 PM   Specimen: Vaginal/Rectal; Genital  Result Value Ref Range   Group B strep by PCR POSITIVE (A) NEGATIVE  Resp Panel by RT-PCR (Flu A&B, Covid) Vaginal/Rectal   Collection Time: 12/28/20  7:56 PM   Specimen: Vaginal/Rectal; Nasopharyngeal(NP) swabs in vial transport medium  Result Value Ref Range   SARS Coronavirus 2 by RT PCR NEGATIVE NEGATIVE   Influenza A by PCR NEGATIVE NEGATIVE   Influenza B by PCR NEGATIVE NEGATIVE  Results for orders placed or performed in visit on 12/28/20 (from the past 48 hour(s))  WET PREP FOR TRICH, YEAST, CLUE   Collection Time: 12/28/20 12:00 AM  Result Value Ref Range   Trichomonas Exam Negative Negative   Yeast Exam Negative Negative   Clue Cell Exam Negative Negative      Constitutional: NAD, AAOx3  HE/ENT: extraocular movements grossly intact, moist mucous membranes CV: RRR PULM: nl respiratory effort, CTABL Abd: gravid, non-tender, non-distended, soft  Ext: Non-tender, Nonedmeatous  Psych: mood appropriate, speech normal SVE: Dilation: 2 Effacement (%): 50 Cervical Position: Posterior Station: -3 Exam by:: M.Newton, RN  -Exam unchanged after > 2 hours   Fetal Monitor: Baseline: 135 bpm Variability: moderate Accels: Present Decels: none Toco: irregular, mild contractions   Category: I   Assessment: 35 y.o. [redacted]w[redacted]d here for antenatal surveillance during pregnancy.  Principle diagnosis: Uterine contractions in  pregnancy  The primary encounter diagnosis was Anemia affecting pregnancy in third trimester. A diagnosis of Significant discrepancy between uterine size and clinical dates, antepartum was also pertinent to this visit.   Plan: Labor: not present.  No cervical change during triage visit today and exam unchanged from last's night's assessment  Fetal Wellbeing: Reassuring Cat 1 tracing. Reactive NST  Reviewed labor precautions  D/c home stable, precautions reviewed, follow-up as scheduled.   ----- Margaretmary Eddy, CNM Certified Nurse Midwife [facility]

## 2020-12-29 NOTE — Discharge Instructions (Signed)
Signs and Symptoms of Labor Labor is the body's natural process of moving the baby and the placenta out of the uterus. The process of labor usually starts when the baby is full-term,between 37 and 40 weeks of pregnancy. Signs and symptoms that you are close to going into labor As your body prepares for labor and the birth of your baby, you may notice the following symptoms in the weeks and days before true labor starts: Passing a small amount of thick, bloody mucus from your vagina. This is called normal bloody show or losing your mucus plug. This may happen more than a week before labor begins, or right before labor begins, as the opening of the cervix starts to widen (dilate). For some women, the entire mucus plug passes at once. For others, pieces of the mucus plug may gradually pass over several days. Your baby moving (dropping) lower in your pelvis to get into position for birth (lightening). When this happens, you may feel more pressure on your bladder and pelvic bone and less pressure on your ribs. This may make it easier to breathe. It may also cause you to need to urinate more often and have problems with bowel movements. Having "practice contractions," also called Braxton Hicks contractions or false labor. These occur at irregular (unevenly spaced) intervals that are more than 10 minutes apart. False labor contractions are common after exercise or sexual activity. They will stop if you change position, rest, or drink fluids. These contractions are usually mild and do not get stronger over time. They may feel like: A backache or back pain. Mild cramps, similar to menstrual cramps. Tightening or pressure in your abdomen. Other early symptoms include: Nausea or loss of appetite. Diarrhea. Having a sudden burst of energy, or feeling very tired. Mood changes. Having trouble sleeping. Signs and symptoms that labor has begun Signs that you are in labor may include: Having contractions that come  at regular (evenly spaced) intervals and increase in intensity. This may feel like more intense tightening or pressure in your abdomen that moves to your back. Contractions may also feel like rhythmic pain in your upper thighs or back that comes and goes at regular intervals. For first-time mothers, this change in intensity of contractions often occurs at a more gradual pace. Women who have given birth before may notice a more rapid progression of contraction changes. Feeling pressure in the vaginal area. Your water breaking (rupture of membranes). This is when the sac of fluid that surrounds your baby breaks. Fluid leaking from your vagina may be clear or blood-tinged. Labor usually starts within 24 hours of your water breaking, but it may take longer to begin. Some women may feel a sudden gush of fluid. Others notice that their underwear repeatedly becomes damp. Follow these instructions at home:  When labor starts, or if your water breaks, call your health care provider or nurse care line. Based on your situation, they will determine when you should go in for an exam. During early labor, you may be able to rest and manage symptoms at home. Some strategies to try at home include: Breathing and relaxation techniques. Taking a warm bath or shower. Listening to music. Using a heating pad on the lower back for pain. If you are directed to use heat: Place a towel between your skin and the heat source. Leave the heat on for 20-30 minutes. Remove the heat if your skin turns bright red. This is especially important if you are unable to feel   pain, heat, or cold. You may have a greater risk of getting burned. Contact a health care provider if: Your labor has started. Your water breaks. Get help right away if: You have painful, regular contractions that are 5 minutes apart or less. Labor starts before you are [redacted] weeks along in your pregnancy. You have a fever. You have bright red blood coming from  your vagina. You do not feel your baby moving. You have a severe headache with or without vision problems. You have severe nausea, vomiting, or diarrhea. You have chest pain or shortness of breath. These symptoms may represent a serious problem that is an emergency. Do not wait to see if the symptoms will go away. Get medical help right away. Call your local emergency services (911 in the [place] Do not drive yourself to the hospital. Summary Labor is your body's natural process of moving your baby and the placenta out of your uterus. The process of labor usually starts when your baby is full-term, between 36 and 40 weeks of pregnancy. When labor starts, or if your water breaks, call your health care provider or nurse care line. Based on your situation, they will determine when you should go in for an exam.

## 2020-12-29 NOTE — OB Triage Note (Signed)
Patient presents to L&D for evaluation with complaints of contractions that have persisted since last night.  Patient was seen here last night and discharged home.  She reports the contractions are a little worse than before and coming about every 5 minutes apart.  She denies any leaking of fluid, vaginal bleeding or decreased fetal movement. Monitors applied, assessing.

## 2020-12-30 NOTE — Discharge Summary (Signed)
TRIAGE VISIT with NST   Toni Black is a 35 y.o. [phone]. She is at [redacted]w[redacted]d gestation, presenting with signs of labor.  Indication: Contractions  S: Resting comfortably. intermittent CTX, no VB. Active fetal movement. Concerned about labor O:  BP 128/87 (BP Location: Left Arm)   Pulse 100   Temp 98.5 F (36.9 C) (Oral)   Resp 14   LMP 04/14/2020 (Exact Date)  Results for orders placed or performed during the hospital encounter of 12/28/20 (from the past 48 hour(s))  Group B strep by PCR   Collection Time: 12/28/20  7:56 PM   Specimen: Vaginal/Rectal; Genital  Result Value Ref Range   Group B strep by PCR POSITIVE (A) NEGATIVE  Resp Panel by RT-PCR (Flu A&B, Covid) Vaginal/Rectal   Collection Time: 12/28/20  7:56 PM   Specimen: Vaginal/Rectal; Nasopharyngeal(NP) swabs in vial transport medium  Result Value Ref Range   SARS Coronavirus 2 by RT PCR NEGATIVE NEGATIVE   Influenza A by PCR NEGATIVE NEGATIVE   Influenza B by PCR NEGATIVE NEGATIVE      Gen: NAD, AAOx3      Abd: FNTTP      Ext: Non-tender, Nonedmeatous    NST/FHT: 140/mod var, +accels, no decels TOCO: quiet HGD:JMEQASTM: 2 Effacement (%): 50 Station: -3 Exam by:: K Allred RN  NST: Reactive. See FHT above for particulars.  A/P:  35 y.o. [id number] [redacted]w[redacted]d with intermittent contractions, changed cervix after several hours of monitoring.  Labor: not present.  Fetal Wellbeing: NST reactive Reassuring Cat 1 tracing. D/c home stable, precautions reviewed, follow-up as scheduled.

## 2020-12-31 ENCOUNTER — Observation Stay
Admission: EM | Admit: 2020-12-31 | Discharge: 2020-12-31 | Disposition: A | Discharge status: Home / Self Care | Payer: Medicaid Other | Attending: Obstetrics and Gynecology | Admitting: Obstetrics and Gynecology

## 2020-12-31 ENCOUNTER — Encounter: Payer: Self-pay | Admitting: Obstetrics and Gynecology

## 2020-12-31 ENCOUNTER — Other Ambulatory Visit: Payer: Self-pay

## 2020-12-31 DIAGNOSIS — O26893 Other specified pregnancy related conditions, third trimester: Principal | ICD-10-CM | POA: Insufficient documentation

## 2020-12-31 DIAGNOSIS — E669 Obesity, unspecified: Secondary | ICD-10-CM | POA: Insufficient documentation

## 2020-12-31 DIAGNOSIS — O99213 Obesity complicating pregnancy, third trimester: Secondary | ICD-10-CM | POA: Insufficient documentation

## 2020-12-31 DIAGNOSIS — Z3A37 37 weeks gestation of pregnancy: Secondary | ICD-10-CM | POA: Diagnosis not present

## 2020-12-31 DIAGNOSIS — Z6830 Body mass index (BMI) 30.0-30.9, adult: Secondary | ICD-10-CM | POA: Diagnosis not present

## 2020-12-31 DIAGNOSIS — R109 Unspecified abdominal pain: Secondary | ICD-10-CM | POA: Insufficient documentation

## 2020-12-31 DIAGNOSIS — O99013 Anemia complicating pregnancy, third trimester: Secondary | ICD-10-CM

## 2020-12-31 DIAGNOSIS — O26849 Uterine size-date discrepancy, unspecified trimester: Secondary | ICD-10-CM

## 2020-12-31 DIAGNOSIS — O479 False labor, unspecified: Secondary | ICD-10-CM | POA: Diagnosis present

## 2020-12-31 LAB — CULTURE, BETA STREP (GROUP B ONLY): Strep Gp B Culture: POSITIVE — AB

## 2020-12-31 NOTE — Progress Notes (Signed)
Discharge home. Discharge instructions given. Labor precautions reviewed. Left floor ambulatory. Elaina Hoops

## 2020-12-31 NOTE — Discharge Summary (Signed)
Patient ID: Toni Black MRN: [id number] DOB/AGE: 1985/08/23 35 y.o.  Admit date: 12/31/2020 Discharge date: 12/31/2020  Admission Diagnoses: 35yo G6P5 at [redacted]w[redacted]d presents with abdominal pain/possible irregular UCs.  Denies VB or LOF.  Recent visit: 8/24 and 8/25 for same CC.   Labs: 8/24 - GBS (pos) and wet prep (neg)  Discharge Diagnoses: Early vs latent labor  Factors complicating pregnancy: Abnormal pap smear  Grand multiparity  Obesity in pregnancy  Uterine Size/Dates discrepancy - EFW 48% History of IPV   Prenatal Procedures: NST  Consults: None  Significant Diagnostic Studies: none  Treatments: none  Hospital Course:  This is a 35 y.o. [id number] with IUP at [redacted]w[redacted]d seen for abdminal pain/possible UCs, noted to have a cervical exam of 4/50/-3.  No leaking of fluid and no bleeding.  She was observed, fetal heart rate monitoring remained reassuring, and she had no signs/symptoms of progressing labor or other maternal-fetal concerns.  Her cervical exam was unchanged from admission.  She was deemed stable for discharge to home with outpatient follow up.  Discharge Physical Exam:  Temp 98.3 F (36.8 C) (Oral)   Resp 16   Ht 5\' 2"  (1.575 m)   Wt 74.4 kg   LMP 04/14/2020 (Exact Date)   BMI 30.00 kg/m   General: NAD CV: RRR Pulm: nl effort ABD: s/nd/nt, gravid DVT Evaluation: LE non-ttp, no evidence of DVT on exam.  NST: FHR baseline: 125 bpm Variability: moderate Accelerations: yes Decelerations: none Category/reactivity: reactive  TOCO: 2-3 in 2 hours SVE:  Dilation: 4 Effacement (%): 50 Cervical Position: Middle Station: -3 Presentation: Vertex Exam by:: LSE   Discharge Condition: Stable  Disposition: Discharge disposition: 01-Home or Self Care       Allergies as of 12/31/2020   No Known Allergies      Medication List     TAKE these medications    ferrous sulfate 325 (65 FE) MG tablet Take 1 tablet (325 mg total) by mouth daily with  breakfast.   prenatal multivitamin Tabs tablet Take 1 tablet by mouth daily at 12 noon.         Signed8/29/2022, CNM 12/31/2020 12:56 PM

## 2020-12-31 NOTE — OB Triage Note (Signed)
Pt reports having "bad contractions" since last night. Denies vaginal bleeding, LOF. Does report vaginal discharge. Elaina Hoops

## 2021-01-01 ENCOUNTER — Other Ambulatory Visit: Payer: Self-pay

## 2021-01-01 ENCOUNTER — Encounter: Payer: Self-pay | Admitting: Obstetrics and Gynecology

## 2021-01-01 ENCOUNTER — Observation Stay
Admission: EM | Admit: 2021-01-01 | Discharge: 2021-01-01 | Disposition: A | Discharge status: Home / Self Care | Payer: Medicaid Other | Attending: Obstetrics and Gynecology | Admitting: Obstetrics and Gynecology

## 2021-01-01 DIAGNOSIS — O99213 Obesity complicating pregnancy, third trimester: Secondary | ICD-10-CM | POA: Insufficient documentation

## 2021-01-01 DIAGNOSIS — Z3A37 37 weeks gestation of pregnancy: Secondary | ICD-10-CM | POA: Insufficient documentation

## 2021-01-01 DIAGNOSIS — O99013 Anemia complicating pregnancy, third trimester: Secondary | ICD-10-CM

## 2021-01-01 DIAGNOSIS — O471 False labor at or after 37 completed weeks of gestation: Secondary | ICD-10-CM | POA: Diagnosis not present

## 2021-01-01 DIAGNOSIS — O26849 Uterine size-date discrepancy, unspecified trimester: Secondary | ICD-10-CM

## 2021-01-01 DIAGNOSIS — E669 Obesity, unspecified: Secondary | ICD-10-CM | POA: Insufficient documentation

## 2021-01-01 DIAGNOSIS — O0943 Supervision of pregnancy with grand multiparity, third trimester: Secondary | ICD-10-CM | POA: Diagnosis not present

## 2021-01-01 DIAGNOSIS — R87619 Unspecified abnormal cytological findings in specimens from cervix uteri: Secondary | ICD-10-CM | POA: Insufficient documentation

## 2021-01-01 DIAGNOSIS — O4290 Premature rupture of membranes, unspecified as to length of time between rupture and onset of labor, unspecified weeks of gestation: Secondary | ICD-10-CM | POA: Diagnosis present

## 2021-01-01 LAB — RUPTURE OF MEMBRANE (ROM)PLUS: Rom Plus: NEGATIVE

## 2021-01-01 NOTE — OB Triage Note (Signed)
Patient comes to OBS 4 with complaint of contractions and leaking of fluid/discharge down her legs around 12 noon today.  Patient describes leakage as whitish colored mucus.  Contraction pain is felt in lower abdomen and back rated 6/10.  She reports that baby has been moving well.  No bleeding noted.  Monitors applied and assessing.

## 2021-01-01 NOTE — Discharge Instructions (Signed)
Please keep your next scheduled appointment.  You may call your on call provider with questions and concerns.  If you have urgent concerns please go to your nearest emergency department for evaluation.

## 2021-01-01 NOTE — Discharge Summary (Signed)
Patient ID: Toni Black MRN: [id number] DOB/AGE: 34-Jan-1987 34 y.o.  Admit date: 01/01/2021 Discharge date: 01/01/2021  Admission Diagnoses: 35yo G6P5 at [redacted]w[redacted]d presents with LOF and UCs.  Denies VB. Recent visit: 8/24, 8/25, 8/27 for same CC.   Labs: 8/24 - GBS (pos) and wet prep (neg)  Discharge Diagnoses: Early vs latent labor  Factors complicating pregnancy: Abnormal pap smear  Grand multiparity  Obesity in pregnancy  Uterine Size/Dates discrepancy - EFW 48% History of IPV   Prenatal Procedures: NST  Consults: None  Significant Diagnostic Studies:  Results for orders placed or performed during the hospital encounter of 01/01/21 (from the past 24 hour(s))  ROM Plus ([facility] only)     Status: None   Collection Time: 01/01/21  3:21 PM  Result Value Ref Range   Rom Plus NEGATIVE      Treatments: none  Hospital Course:  This is a 35 y.o. [id number] with IUP at [redacted]w[redacted]d seen for LOF and possible UCs, noted to have a cervical exam of 4/50/-3.  No bleeding.  She was observed, fetal heart rate monitoring remained reassuring, and she had no signs/symptoms of progressing labor or other maternal-fetal concerns.  Her cervical exam was unchanged from admission.  She was deemed stable for discharge to home with outpatient follow up.  Discharge Physical Exam:  BP 128/85   Pulse 94   Temp 99.4 F (37.4 C) (Oral)   Resp 14   Ht 5\' 2"  (1.575 m)   Wt 73.9 kg Comment: 163lbs  LMP 04/14/2020 (Exact Date)   BMI 29.81 kg/m   General: NAD CV: RRR Pulm: nl effort ABD: s/nd/nt, gravid DVT Evaluation: LE non-ttp, no evidence of DVT on exam.  NST: FHR baseline: 130 bpm Variability: moderate Accelerations: yes Decelerations: none Category/reactivity: reactive  TOCO: occasional SVE:  Dilation: 4 Effacement (%): 50 Cervical Position: Posterior Station: -3 Presentation: Vertex Exam by:: [email or web address] RN   Discharge Condition: Stable  Disposition: Discharge disposition: 01-Home or  Self Care       Allergies as of 01/01/2021   No Known Allergies      Medication List     TAKE these medications    ferrous sulfate 325 (65 FE) MG tablet Take 1 tablet (325 mg total) by mouth daily with breakfast.   prenatal multivitamin Tabs tablet Take 1 tablet by mouth daily at 12 noon.        Signed8/30/2022, CNM 01/01/2021 7:07 PM

## 2021-01-02 DIAGNOSIS — B951 Streptococcus, group B, as the cause of diseases classified elsewhere: Secondary | ICD-10-CM | POA: Insufficient documentation

## 2021-01-02 LAB — CHLAMYDIA/GC NAA, CONFIRMATION
Chlamydia trachomatis, NAA: NEGATIVE
Neisseria gonorrhoeae, NAA: NEGATIVE

## 2021-01-04 ENCOUNTER — Encounter: Payer: Self-pay | Admitting: Obstetrics and Gynecology

## 2021-01-04 ENCOUNTER — Observation Stay
Admission: EM | Admit: 2021-01-04 | Discharge: 2021-01-04 | Disposition: A | Discharge status: Home / Self Care | Payer: Medicaid Other | Attending: Obstetrics | Admitting: Obstetrics

## 2021-01-04 ENCOUNTER — Ambulatory Visit: Payer: Medicaid Other | Admitting: Advanced Practice Midwife

## 2021-01-04 ENCOUNTER — Other Ambulatory Visit: Payer: Self-pay

## 2021-01-04 VITALS — BP 131/82 | HR 103 | Temp 99.2°F | Wt 168.2 lb

## 2021-01-04 DIAGNOSIS — O479 False labor, unspecified: Secondary | ICD-10-CM | POA: Diagnosis present

## 2021-01-04 DIAGNOSIS — O99213 Obesity complicating pregnancy, third trimester: Secondary | ICD-10-CM | POA: Insufficient documentation

## 2021-01-04 DIAGNOSIS — Z3A37 37 weeks gestation of pregnancy: Secondary | ICD-10-CM | POA: Insufficient documentation

## 2021-01-04 DIAGNOSIS — O09899 Supervision of other high risk pregnancies, unspecified trimester: Secondary | ICD-10-CM

## 2021-01-04 DIAGNOSIS — O99013 Anemia complicating pregnancy, third trimester: Secondary | ICD-10-CM

## 2021-01-04 DIAGNOSIS — E669 Obesity, unspecified: Secondary | ICD-10-CM | POA: Insufficient documentation

## 2021-01-04 DIAGNOSIS — Z8751 Personal history of pre-term labor: Secondary | ICD-10-CM | POA: Insufficient documentation

## 2021-01-04 DIAGNOSIS — O133 Gestational [pregnancy-induced] hypertension without significant proteinuria, third trimester: Secondary | ICD-10-CM | POA: Insufficient documentation

## 2021-01-04 DIAGNOSIS — O26849 Uterine size-date discrepancy, unspecified trimester: Secondary | ICD-10-CM

## 2021-01-04 DIAGNOSIS — O0990 Supervision of high risk pregnancy, unspecified, unspecified trimester: Secondary | ICD-10-CM

## 2021-01-04 DIAGNOSIS — O471 False labor at or after 37 completed weeks of gestation: Principal | ICD-10-CM | POA: Insufficient documentation

## 2021-01-04 DIAGNOSIS — B951 Streptococcus, group B, as the cause of diseases classified elsewhere: Secondary | ICD-10-CM

## 2021-01-04 DIAGNOSIS — O99210 Obesity complicating pregnancy, unspecified trimester: Secondary | ICD-10-CM

## 2021-01-04 LAB — URINALYSIS
Bilirubin, UA: NEGATIVE
Glucose, UA: NEGATIVE
Ketones, UA: NEGATIVE
Nitrite, UA: NEGATIVE
Protein,UA: NEGATIVE
RBC, UA: NEGATIVE
Specific Gravity, UA: 1.015 (ref 1.005–1.030)
Urobilinogen, Ur: 4 mg/dL — ABNORMAL HIGH (ref 0.2–1.0)
pH, UA: 6 (ref 5.0–7.5)
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
## 2021-01-04 NOTE — OB Triage Note (Signed)
Pt [id number] at [redacted]w[redacted]d presents with ctx/pressure that has been ongoing for several days. Pt reports ctx every 4 minutes earlier this morning and pressure is constant. +FM. Denies LOF/bleeding. Vss. Monitors applied.

## 2021-01-04 NOTE — OB Triage Note (Addendum)
Toni Black is a 35 y.o. female. She is at [redacted]w[redacted]d gestation. Patient's last menstrual period was 04/14/2020 (exact date). Estimated Date of Delivery: 01/19/21  Prenatal care site: [facility]  Chief complaint: uterine contractions and pelvic pressure  HPI: Toni Black presents to L&D with complaints of uterine contractions and ongoing pelvic pressure  Factors complicating pregnancy: Abnormal pap smear  Grand multiparity  Obesity in pregnancy  Uterine Size/Dates discrepancy - EFW 48% History of IPV   S: Resting comfortably. no CTX, no VB.no LOF,  Active fetal movement.   Maternal Medical History:  Past Medical Hx:  has a past medical history of Anemia, Anemia, borderline (04/15/2018), Infection, Mental disorder, Pregnancy induced hypertension, Preterm labor, Teeth problem, and Vaginal Pap smear, abnormal.    Past Surgical Hx:  has a past surgical history that includes No past surgeries and Denies surgical history.   No Known Allergies   Prior to Admission medications   Medication Sig Start Date End Date Taking? Authorizing Provider  ferrous sulfate 325 (65 FE) MG tablet Take 1 tablet (325 mg total) by mouth daily with breakfast. 10/20/20  Yes Federico Flake, MD  Prenatal Vit-Fe Fumarate-FA (PRENATAL MULTIVITAMIN) TABS tablet Take 1 tablet by mouth daily at 12 noon. 09/29/20  Yes Streilein, Mathis Dad, PA-C    Social History: She  reports that she has never smoked. She has never used smokeless tobacco. She reports that she does not drink alcohol and does not use drugs.  Family History: family history includes Arthritis in her maternal aunt; Asthma in her son; Diabetes in her maternal aunt; Fibroids in her mother; Hypertension in her father and mother; Leukemia in her maternal grandmother; Migraines in her father and mother; Throat cancer in her maternal grandfather. No history of gyn cancers  Review of Systems: A full review of systems was performed and negative  except as noted in the HPI.    O:  BP 126/78   Pulse 89   Temp 98.4 F (36.9 C) (Oral)   Resp 16   Ht 5\' 2"  (1.575 m)   Wt 73.9 kg   LMP 04/14/2020 (Exact Date)   BMI 29.81 kg/m  No results found for this or any previous visit (from the past 48 hour(s)).   Constitutional: NAD, AAOx3  HE/ENT: extraocular movements grossly intact, moist mucous membranes CV: RRR PULM: nl respiratory effort, CTABL Abd: gravid, non-tender, non-distended, soft  Ext: Non-tender, Nonedmeatous Psych: mood appropriate, speech normal Pelvic :  upon vaginal exam no bloody show present SVE: Dilation: 4 Effacement (%): 50 Cervical Position: Middle Station: -3 Exam by:: J lunsford RN   Fetal Monitor: Baseline: 125 bpm Variability: moderate Accels: Present Decels: none Toco: irritability  Category: I   Assessment: 35 y.o. [redacted]w[redacted]d here for antenatal surveillance during pregnancy.  Principle diagnosis: early labor The primary encounter diagnosis was Significant discrepancy between uterine size and clinical dates, antepartum. Diagnoses of Anemia affecting pregnancy in third trimester and Group beta Strep positive were also pertinent to this visit.   Plan: -monitor patient for 2 hours if no cervical change and baby is reactive will discharge home   ----- [redacted]w[redacted]d CNM Certified Nurse Midwife [facility]  Clinic OB[facility]N [facility]

## 2021-01-04 NOTE — Discharge Summary (Addendum)
Toni Black is a 35 y.o. female. She is at [redacted]w[redacted]d gestation. Patient's last menstrual period was 04/14/2020 (exact date). Estimated Date of Delivery: 01/19/21  Prenatal care site: [facility]  Chief complaint: uterine contractions and pelvic pressure  HPI: Toni Black presents to L&D with complaints of uterine contractions and ongoing pelvic pressure  Factors complicating pregnancy: Abnormal pap smear  Grand multiparity  Obesity in pregnancy  Uterine Size/Dates discrepancy - EFW 48% History of IPV   S: Resting comfortably. no CTX, no VB.no LOF,  Active fetal movement.   Maternal Medical History:  Past Medical Hx:  has a past medical history of Anemia, Anemia, borderline (04/15/2018), Infection, Mental disorder, Pregnancy induced hypertension, Preterm labor, Teeth problem, and Vaginal Pap smear, abnormal.    Past Surgical Hx:  has a past surgical history that includes No past surgeries and Denies surgical history.   No Known Allergies   Prior to Admission medications   Medication Sig Start Date End Date Taking? Authorizing Provider  ferrous sulfate 325 (65 FE) MG tablet Take 1 tablet (325 mg total) by mouth daily with breakfast. 10/20/20  Yes Federico Flake, MD  Prenatal Vit-Fe Fumarate-FA (PRENATAL MULTIVITAMIN) TABS tablet Take 1 tablet by mouth daily at 12 noon. 09/29/20  Yes Streilein, Mathis Dad, PA-C    Social History: She  reports that she has never smoked. She has never used smokeless tobacco. She reports that she does not drink alcohol and does not use drugs.  Family History: family history includes Arthritis in her maternal aunt; Asthma in her son; Diabetes in her maternal aunt; Fibroids in her mother; Hypertension in her father and mother; Leukemia in her maternal grandmother; Migraines in her father and mother; Throat cancer in her maternal grandfather. No history of gyn cancers  Review of Systems: A full review of systems was performed and negative  except as noted in the HPI.    O:  BP 126/78   Pulse 89   Temp 98.4 F (36.9 C) (Oral)   Resp 16   Ht 5\' 2"  (1.575 m)   Wt 73.9 kg   LMP 04/14/2020 (Exact Date)   BMI 29.81 kg/m  No results found for this or any previous visit (from the past 48 hour(s)).   Constitutional: NAD, AAOx3  HE/ENT: extraocular movements grossly intact, moist mucous membranes CV: RRR PULM: nl respiratory effort, CTABL Abd: gravid, non-tender, non-distended, soft  Ext: Non-tender, Nonedmeatous Psych: mood appropriate, speech normal Pelvic :  upon vaginal exam no bloody show present SVE: Dilation: 4 Effacement (%): 50 Cervical Position: Middle Station: -3 Exam by:: B Nielsen RN   Fetal Monitor: Baseline: 125 bpm Variability: moderate Accels: Present Decels: none Toco: irritability  Category: I   Assessment: 35 y.o. [redacted]w[redacted]d here for antenatal surveillance during pregnancy.  Principle diagnosis: early labor The primary encounter diagnosis was Significant discrepancy between uterine size and clinical dates, antepartum. Diagnoses of Anemia affecting pregnancy in third trimester and Group beta Strep positive were also pertinent to this visit.   Plan: Labor: not present.  No cervical change during triage visit today and exam unchanged from last assessment  Fetal Wellbeing: Reassuring Cat 1 tracing. Reactive NST  Reviewed labor precautions  D/c home stable, precautions reviewed, follow-up as scheduled   ----- [redacted]w[redacted]d CNM Certified Nurse Midwife [place]  Clinic OB/GYN [facility]

## 2021-01-06 LAB — URINE CULTURE

## 2021-01-10 ENCOUNTER — Ambulatory Visit: Payer: Medicaid Other | Admitting: Family Medicine

## 2021-01-10 ENCOUNTER — Other Ambulatory Visit: Payer: Self-pay

## 2021-01-10 ENCOUNTER — Telehealth: Payer: Self-pay

## 2021-01-10 ENCOUNTER — Ambulatory Visit: Payer: Medicaid Other

## 2021-01-10 VITALS — BP 131/87 | HR 78 | Temp 98.5°F | Wt 168.8 lb

## 2021-01-10 DIAGNOSIS — O0993 Supervision of high risk pregnancy, unspecified, third trimester: Secondary | ICD-10-CM

## 2021-01-10 DIAGNOSIS — O169 Unspecified maternal hypertension, unspecified trimester: Secondary | ICD-10-CM

## 2021-01-10 DIAGNOSIS — O99013 Anemia complicating pregnancy, third trimester: Secondary | ICD-10-CM

## 2021-01-10 LAB — URINALYSIS
Bilirubin, UA: NEGATIVE
Glucose, UA: NEGATIVE
Ketones, UA: NEGATIVE
Nitrite, UA: NEGATIVE
RBC, UA: NEGATIVE
Specific Gravity, UA: 1.02 (ref 1.005–1.030)
Urobilinogen, Ur: 1 mg/dL (ref 0.2–1.0)
pH, UA: 6 (ref 5.0–7.5)

## 2021-01-10 NOTE — Progress Notes (Addendum)
Correctly verbalizes how to take PNV and iron. Taking iron tablet with juice. Jossie Ng, RN Urine dip reviewed by Elveria Rising FNP-BC. Jossie Ng, RN

## 2021-01-10 NOTE — Telephone Encounter (Signed)
Call received directly into clinic from client. Client called to tell clinic staff that her boyfriend was going to take her to the hospital because her BP was elevated in clinic this am. Client counseled that she did not need to go to the hospital based on am BP in clinic. Counseled that if needed L & D evaluation based on clinic findings this am that provider would have referred her to L & D. Client also asked if she had pre-eclampsia. Counseled that not diagnosed with that, currently, but  results from bloodwork and urine specimen obtained this am were not yet available. Also counseled client that having pre-eclampsia did not automatically mean that you needed to go to the hospital. Per client, nothing has changed with how she is feeling since am clinic evaluation. Jossie Ng, RN
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
## 2021-01-11 ENCOUNTER — Other Ambulatory Visit: Payer: Self-pay

## 2021-01-11 ENCOUNTER — Inpatient Hospital Stay: Payer: Medicaid Other | Admitting: Anesthesiology

## 2021-01-11 ENCOUNTER — Encounter
Admission: EM | Disposition: A | Discharge status: Home / Self Care | Payer: Self-pay | Source: Home / Self Care | Attending: Obstetrics

## 2021-01-11 ENCOUNTER — Encounter: Payer: Self-pay | Admitting: Obstetrics and Gynecology

## 2021-01-11 ENCOUNTER — Inpatient Hospital Stay
Admission: EM | Admit: 2021-01-11 | Discharge: 2021-01-13 | DRG: 768 | Disposition: A | Discharge status: Home / Self Care | Payer: Medicaid Other | Attending: Obstetrics | Admitting: Obstetrics

## 2021-01-11 ENCOUNTER — Observation Stay
Admission: EM | Admit: 2021-01-11 | Discharge: 2021-01-11 | Disposition: A | Discharge status: Home / Self Care | Payer: Medicaid Other | Source: Home / Self Care | Admitting: Obstetrics and Gynecology

## 2021-01-11 DIAGNOSIS — O26849 Uterine size-date discrepancy, unspecified trimester: Secondary | ICD-10-CM

## 2021-01-11 DIAGNOSIS — D509 Iron deficiency anemia, unspecified: Secondary | ICD-10-CM | POA: Diagnosis present

## 2021-01-11 DIAGNOSIS — Z8751 Personal history of pre-term labor: Secondary | ICD-10-CM | POA: Insufficient documentation

## 2021-01-11 DIAGNOSIS — Z20822 Contact with and (suspected) exposure to covid-19: Secondary | ICD-10-CM | POA: Diagnosis present

## 2021-01-11 DIAGNOSIS — E669 Obesity, unspecified: Secondary | ICD-10-CM | POA: Insufficient documentation

## 2021-01-11 DIAGNOSIS — Z3A38 38 weeks gestation of pregnancy: Secondary | ICD-10-CM

## 2021-01-11 DIAGNOSIS — O0993 Supervision of high risk pregnancy, unspecified, third trimester: Secondary | ICD-10-CM | POA: Diagnosis not present

## 2021-01-11 DIAGNOSIS — O99214 Obesity complicating childbirth: Secondary | ICD-10-CM | POA: Diagnosis present

## 2021-01-11 DIAGNOSIS — O26893 Other specified pregnancy related conditions, third trimester: Secondary | ICD-10-CM | POA: Diagnosis present

## 2021-01-11 DIAGNOSIS — B951 Streptococcus, group B, as the cause of diseases classified elsewhere: Secondary | ICD-10-CM

## 2021-01-11 DIAGNOSIS — O99824 Streptococcus B carrier state complicating childbirth: Secondary | ICD-10-CM | POA: Diagnosis present

## 2021-01-11 DIAGNOSIS — O133 Gestational [pregnancy-induced] hypertension without significant proteinuria, third trimester: Secondary | ICD-10-CM | POA: Insufficient documentation

## 2021-01-11 DIAGNOSIS — O1203 Gestational edema, third trimester: Secondary | ICD-10-CM | POA: Insufficient documentation

## 2021-01-11 DIAGNOSIS — O99013 Anemia complicating pregnancy, third trimester: Secondary | ICD-10-CM

## 2021-01-11 DIAGNOSIS — O99213 Obesity complicating pregnancy, third trimester: Secondary | ICD-10-CM | POA: Insufficient documentation

## 2021-01-11 DIAGNOSIS — O9902 Anemia complicating childbirth: Secondary | ICD-10-CM | POA: Diagnosis present

## 2021-01-11 HISTORY — PX: DILATION AND CURETTAGE OF UTERUS: SHX78

## 2021-01-11 LAB — CBC
HCT: 27.3 % — ABNORMAL LOW (ref 36.0–46.0)
HCT: 29 % — ABNORMAL LOW (ref 36.0–46.0)
Hemoglobin: 8.5 g/dL — ABNORMAL LOW (ref 12.0–15.0)
Hemoglobin: 9.2 g/dL — ABNORMAL LOW (ref 12.0–15.0)
MCH: 23.3 pg — ABNORMAL LOW (ref 26.0–34.0)
MCH: 23.5 pg — ABNORMAL LOW (ref 26.0–34.0)
MCHC: 31.1 g/dL (ref 30.0–36.0)
MCHC: 31.7 g/dL (ref 30.0–36.0)
MCV: 74.8 fL — ABNORMAL LOW (ref 80.0–100.0)
MCV: 74 fL — ABNORMAL LOW (ref 80.0–100.0)
Platelets: 250 10*3/uL (ref 150–400)
Platelets: 251 10*3/uL (ref 150–400)
RBC: 3.65 MIL/uL — ABNORMAL LOW (ref 3.87–5.11)
RBC: 3.92 MIL/uL (ref 3.87–5.11)
RDW: 15.8 % — ABNORMAL HIGH (ref 11.5–15.5)
RDW: 16 % — ABNORMAL HIGH (ref 11.5–15.5)
WBC: 7.9 10*3/uL (ref 4.0–10.5)
WBC: 8.9 10*3/uL (ref 4.0–10.5)
nRBC: 0 % (ref 0.0–0.2)
nRBC: 0 % (ref 0.0–0.2)

## 2021-01-11 LAB — AST+BUN+CREAT+LD+URIC A+HGB...
AST: 15 IU/L (ref 0–40)
BUN: 5 mg/dL — ABNORMAL LOW (ref 6–20)
Creatinine, Ser: 0.78 mg/dL (ref 0.57–1.00)
Hematocrit: 28.9 % — ABNORMAL LOW (ref 34.0–46.6)
Hemoglobin: 9.1 g/dL — ABNORMAL LOW (ref 11.1–15.9)
LDH: 191 IU/L (ref 119–226)
Platelets: 259 10*3/uL (ref 150–450)
Uric Acid: 6.1 mg/dL (ref 2.6–6.2)
eGFR: 102 mL/min/{1.73_m2} (ref >59)

## 2021-01-11 LAB — PROTEIN / CREATININE RATIO, URINE
Creatinine, Urine: 161.4 mg/dL
Creatinine, Urine: 82 mg/dL
Protein Creatinine Ratio: 0.11 mg/mg{Cre} (ref 0.00–0.15)
Protein, Ur: 23.7 mg/dL
Protein/Creat Ratio: 147 mg/g creat (ref 0–200)
Total Protein, Urine: 9 mg/dL

## 2021-01-11 LAB — COMPREHENSIVE METABOLIC PANEL
ALT: 12 U/L (ref 0–44)
AST: 17 U/L (ref 15–41)
Albumin: 2.7 g/dL — ABNORMAL LOW (ref 3.5–5.0)
Alkaline Phosphatase: 161 U/L — ABNORMAL HIGH (ref 38–126)
Anion gap: 6 (ref 5–15)
BUN: 5 mg/dL — ABNORMAL LOW (ref 6–20)
CO2: 19 mmol/L — ABNORMAL LOW (ref 22–32)
Calcium: 8.2 mg/dL — ABNORMAL LOW (ref 8.9–10.3)
Chloride: 112 mmol/L — ABNORMAL HIGH (ref 98–111)
Creatinine, Ser: 0.81 mg/dL (ref 0.44–1.00)
GFR, Estimated: >60 mL/min (ref >60)
Glucose, Bld: 76 mg/dL (ref 70–99)
Potassium: 3.8 mmol/L (ref 3.5–5.1)
Sodium: 137 mmol/L (ref 135–145)
Total Bilirubin: 0.6 mg/dL (ref 0.3–1.2)
Total Protein: 5.9 g/dL — ABNORMAL LOW (ref 6.5–8.1)

## 2021-01-11 LAB — RESP PANEL BY RT-PCR (FLU A&B, COVID) ARPGX2
Influenza A by PCR: NEGATIVE
Influenza B by PCR: NEGATIVE
SARS Coronavirus 2 by RT PCR: NEGATIVE

## 2021-01-11 LAB — PREPARE RBC (CROSSMATCH)

## 2021-01-11 SURGERY — DILATION AND CURETTAGE
Anesthesia: General

## 2021-01-11 MED ORDER — ONDANSETRON HCL 4 MG/2ML IJ SOLN
INTRAMUSCULAR | Status: DC | PRN
  Administered 2021-01-11: 4 mg via INTRAVENOUS

## 2021-01-11 MED ORDER — LIDOCAINE HCL (PF) 1 % IJ SOLN
INTRAMUSCULAR | Status: AC
  Filled 2021-01-11: qty 30

## 2021-01-11 MED ORDER — FENTANYL CITRATE (PF) 100 MCG/2ML IJ SOLN
INTRAMUSCULAR | Status: AC
  Filled 2021-01-11: qty 2

## 2021-01-11 MED ORDER — PROPOFOL 10 MG/ML IV BOLUS
INTRAVENOUS | Status: AC
  Filled 2021-01-11: qty 20

## 2021-01-11 MED ORDER — OXYTOCIN-SODIUM CHLORIDE 30-0.9 UT/500ML-% IV SOLN: 1.0000 m[IU]/min | INTRAVENOUS | Status: DC

## 2021-01-11 MED ORDER — MIDAZOLAM HCL 2 MG/2ML IJ SOLN
INTRAMUSCULAR | Status: DC | PRN
  Administered 2021-01-11: 2 mg via INTRAVENOUS

## 2021-01-11 MED ORDER — ACETAMINOPHEN 325 MG PO TABS: 650.0000 mg | ORAL_TABLET | ORAL | Status: DC | PRN

## 2021-01-11 MED ORDER — OXYCODONE HCL 5 MG PO TABS: 5.0000 mg | ORAL_TABLET | ORAL | Status: DC | PRN

## 2021-01-11 MED ORDER — MISOPROSTOL 200 MCG PO TABS
ORAL_TABLET | ORAL | Status: AC
  Administered 2021-01-11: 800 ug via RECTAL
  Filled 2021-01-11: qty 4

## 2021-01-11 MED ORDER — OXYCODONE HCL 5 MG/5ML PO SOLN: 5.0000 mg | Freq: Once | ORAL | Status: DC | PRN

## 2021-01-11 MED ORDER — ZOLPIDEM TARTRATE 5 MG PO TABS: 5.0000 mg | ORAL_TABLET | Freq: Every evening | ORAL | Status: DC | PRN

## 2021-01-11 MED ORDER — LACTATED RINGERS IV SOLN: INTRAVENOUS | Status: DC

## 2021-01-11 MED ORDER — LIDOCAINE HCL (PF) 2 % IJ SOLN
INTRAMUSCULAR | Status: AC
  Filled 2021-01-11: qty 5

## 2021-01-11 MED ORDER — OXYTOCIN-SODIUM CHLORIDE 30-0.9 UT/500ML-% IV SOLN
INTRAVENOUS | Status: AC
  Administered 2021-01-11: 2 m[IU]/min via INTRAVENOUS
  Filled 2021-01-11: qty 1000

## 2021-01-11 MED ORDER — OXYTOCIN-SODIUM CHLORIDE 30-0.9 UT/500ML-% IV SOLN
2.5000 [IU]/h | INTRAVENOUS | Status: DC
  Filled 2021-01-11: qty 1000

## 2021-01-11 MED ORDER — FENTANYL CITRATE (PF) 100 MCG/2ML IJ SOLN
INTRAMUSCULAR | Status: DC | PRN
  Administered 2021-01-11 (×4): 50 ug via INTRAVENOUS

## 2021-01-11 MED ORDER — DIPHENHYDRAMINE HCL 25 MG PO CAPS: 25.0000 mg | ORAL_CAPSULE | Freq: Four times a day (QID) | ORAL | Status: DC | PRN

## 2021-01-11 MED ORDER — MORPHINE SULFATE (PF) 2 MG/ML IV SOLN: 1.0000 mg | INTRAVENOUS | Status: DC | PRN

## 2021-01-11 MED ORDER — AMMONIA AROMATIC IN INHA
RESPIRATORY_TRACT | Status: AC
  Filled 2021-01-11: qty 10

## 2021-01-11 MED ORDER — ACETAMINOPHEN 10 MG/ML IV SOLN
INTRAVENOUS | Status: DC | PRN
  Administered 2021-01-11: 1000 mg via INTRAVENOUS

## 2021-01-11 MED ORDER — OXYCODONE HCL 5 MG/5ML PO SOLN
10.0000 mg | ORAL | Status: DC | PRN
  Filled 2021-01-11: qty 10

## 2021-01-11 MED ORDER — SODIUM CHLORIDE 0.9 % IV SOLN
1.0000 g | INTRAVENOUS | Status: DC
  Administered 2021-01-11: 1 g via INTRAVENOUS
  Filled 2021-01-11: qty 1000

## 2021-01-11 MED ORDER — SIMETHICONE 80 MG PO CHEW: 80.0000 mg | CHEWABLE_TABLET | ORAL | Status: DC | PRN

## 2021-01-11 MED ORDER — METHYLERGONOVINE MALEATE 0.2 MG/ML IJ SOLN
INTRAMUSCULAR | Status: AC
  Filled 2021-01-11: qty 1

## 2021-01-11 MED ORDER — BENZOCAINE-MENTHOL 20-0.5 % EX AERO: 1.0000 "application " | INHALATION_SPRAY | CUTANEOUS | Status: DC | PRN

## 2021-01-11 MED ORDER — COCONUT OIL OIL: 1.0000 "application " | TOPICAL_OIL | Status: DC | PRN

## 2021-01-11 MED ORDER — SUCCINYLCHOLINE CHLORIDE 200 MG/10ML IV SOSY
PREFILLED_SYRINGE | INTRAVENOUS | Status: AC
  Filled 2021-01-11: qty 10

## 2021-01-11 MED ORDER — SILVER SULFADIAZINE 1 % EX CREA
TOPICAL_CREAM | CUTANEOUS | Status: DC | PRN
  Administered 2021-01-11: 1 via TOPICAL

## 2021-01-11 MED ORDER — BUTORPHANOL TARTRATE 1 MG/ML IJ SOLN: 1.0000 mg | INTRAMUSCULAR | Status: DC | PRN

## 2021-01-11 MED ORDER — IBUPROFEN 600 MG PO TABS: 600.0000 mg | ORAL_TABLET | Freq: Four times a day (QID) | ORAL | Status: DC

## 2021-01-11 MED ORDER — SUCCINYLCHOLINE CHLORIDE 200 MG/10ML IV SOSY
PREFILLED_SYRINGE | INTRAVENOUS | Status: DC | PRN
  Administered 2021-01-11: 100 mg via INTRAVENOUS

## 2021-01-11 MED ORDER — LIDOCAINE HCL (PF) 1 % IJ SOLN: 30.0000 mL | INTRAMUSCULAR | Status: DC | PRN

## 2021-01-11 MED ORDER — OXYTOCIN 10 UNIT/ML IJ SOLN
INTRAMUSCULAR | Status: AC
  Filled 2021-01-11: qty 2

## 2021-01-11 MED ORDER — MAGNESIUM HYDROXIDE 400 MG/5ML PO SUSP: 30.0000 mL | ORAL | Status: DC | PRN

## 2021-01-11 MED ORDER — DIBUCAINE (PERIANAL) 1 % EX OINT: 1.0000 "application " | TOPICAL_OINTMENT | CUTANEOUS | Status: DC | PRN

## 2021-01-11 MED ORDER — DEXAMETHASONE SODIUM PHOSPHATE 10 MG/ML IJ SOLN
INTRAMUSCULAR | Status: DC | PRN
  Administered 2021-01-11: 10 mg via INTRAVENOUS

## 2021-01-11 MED ORDER — SILVER SULFADIAZINE 1 % EX CREA
TOPICAL_CREAM | CUTANEOUS | Status: AC
  Filled 2021-01-11: qty 85

## 2021-01-11 MED ORDER — PRENATAL MULTIVITAMIN CH: 1.0000 | ORAL_TABLET | Freq: Every day | ORAL | Status: DC

## 2021-01-11 MED ORDER — LACTATED RINGERS IV SOLN: 500.0000 mL | INTRAVENOUS | Status: DC | PRN

## 2021-01-11 MED ORDER — ACETAMINOPHEN 325 MG PO TABS: 650.0000 mg | ORAL_TABLET | Freq: Once | ORAL | Status: DC

## 2021-01-11 MED ORDER — MISOPROSTOL 200 MCG PO TABS
ORAL_TABLET | ORAL | Status: AC
  Filled 2021-01-11: qty 4

## 2021-01-11 MED ORDER — SOD CITRATE-CITRIC ACID 500-334 MG/5ML PO SOLN
30.0000 mL | ORAL | Status: DC | PRN
Start: 2021-01-11 — End: 2021-01-11

## 2021-01-11 MED ORDER — FERROUS SULFATE 325 (65 FE) MG PO TABS: 325.0000 mg | ORAL_TABLET | Freq: Two times a day (BID) | ORAL | Status: DC

## 2021-01-11 MED ORDER — TERBUTALINE SULFATE 1 MG/ML IJ SOLN
0.2500 mg | Freq: Once | INTRAMUSCULAR | Status: DC | PRN
Start: 2021-01-11 — End: 2021-01-11

## 2021-01-11 MED ORDER — OXYTOCIN BOLUS FROM INFUSION
333.0000 mL | Freq: Once | INTRAVENOUS | Status: AC
  Administered 2021-01-11: 333 mL via INTRAVENOUS

## 2021-01-11 MED ORDER — WITCH HAZEL-GLYCERIN EX PADS: 1.0000 "application " | MEDICATED_PAD | CUTANEOUS | Status: DC | PRN

## 2021-01-11 MED ORDER — LIDOCAINE HCL (CARDIAC) PF 100 MG/5ML IV SOSY
PREFILLED_SYRINGE | INTRAVENOUS | Status: DC | PRN
  Administered 2021-01-11: 60 mg via INTRAVENOUS

## 2021-01-11 MED ORDER — SENNOSIDES-DOCUSATE SODIUM 8.6-50 MG PO TABS: 2.0000 | ORAL_TABLET | ORAL | Status: DC

## 2021-01-11 MED ORDER — PROPOFOL 10 MG/ML IV BOLUS
INTRAVENOUS | Status: DC | PRN
  Administered 2021-01-11: 130 mg via INTRAVENOUS

## 2021-01-11 MED ORDER — CEFAZOLIN SODIUM-DEXTROSE 2-3 GM-%(50ML) IV SOLR
INTRAVENOUS | Status: DC | PRN
Start: 2021-01-11 — End: 2021-01-11
  Administered 2021-01-11: 2 g via INTRAVENOUS

## 2021-01-11 MED ORDER — OXYCODONE HCL 5 MG PO TABS: 10.0000 mg | ORAL_TABLET | ORAL | Status: DC | PRN

## 2021-01-11 MED ORDER — ONDANSETRON HCL 4 MG/2ML IJ SOLN
INTRAMUSCULAR | Status: AC
  Filled 2021-01-11: qty 2

## 2021-01-11 MED ORDER — ONDANSETRON HCL 4 MG PO TABS: 4.0000 mg | ORAL_TABLET | ORAL | Status: DC | PRN

## 2021-01-11 MED ORDER — ONDANSETRON HCL 4 MG/2ML IJ SOLN: 4.0000 mg | INTRAMUSCULAR | Status: DC | PRN

## 2021-01-11 MED ORDER — DEXAMETHASONE SODIUM PHOSPHATE 10 MG/ML IJ SOLN
INTRAMUSCULAR | Status: AC
  Filled 2021-01-11: qty 1

## 2021-01-11 MED ORDER — CARBOPROST TROMETHAMINE 250 MCG/ML IM SOLN
INTRAMUSCULAR | Status: AC
  Filled 2021-01-11: qty 1

## 2021-01-11 MED ORDER — MISOPROSTOL 200 MCG PO TABS: 800.0000 ug | ORAL_TABLET | Freq: Once | ORAL | Status: AC

## 2021-01-11 MED ORDER — SODIUM CHLORIDE 0.9% IV SOLUTION: Freq: Once | INTRAVENOUS | Status: DC

## 2021-01-11 MED ORDER — FENTANYL CITRATE (PF) 100 MCG/2ML IJ SOLN: 25.0000 ug | INTRAMUSCULAR | Status: DC | PRN

## 2021-01-11 MED ORDER — MEASLES, MUMPS & RUBELLA VAC IJ SOLR
0.5000 mL | Freq: Once | INTRAMUSCULAR | Status: DC
  Filled 2021-01-11: qty 0.5

## 2021-01-11 MED ORDER — ACETAMINOPHEN 10 MG/ML IV SOLN
INTRAVENOUS | Status: AC
  Filled 2021-01-11: qty 100

## 2021-01-11 MED ORDER — CEFAZOLIN SODIUM 1 G IJ SOLR
INTRAMUSCULAR | Status: AC
  Filled 2021-01-11: qty 20

## 2021-01-11 MED ORDER — OXYCODONE HCL 5 MG PO TABS: 5.0000 mg | ORAL_TABLET | Freq: Once | ORAL | Status: DC | PRN

## 2021-01-11 MED ORDER — ONDANSETRON HCL 4 MG/2ML IJ SOLN: 4.0000 mg | Freq: Once | INTRAMUSCULAR | Status: DC | PRN

## 2021-01-11 MED ORDER — OXYCODONE HCL 5 MG/5ML PO SOLN
5.0000 mg | ORAL | Status: DC | PRN
  Filled 2021-01-11: qty 5

## 2021-01-11 MED ORDER — MIDAZOLAM HCL 2 MG/2ML IJ SOLN
INTRAMUSCULAR | Status: AC
  Filled 2021-01-11: qty 2

## 2021-01-11 MED ORDER — ONDANSETRON HCL 4 MG/2ML IJ SOLN
4.0000 mg | Freq: Four times a day (QID) | INTRAMUSCULAR | Status: DC | PRN
Start: 2021-01-11 — End: 2021-01-11

## 2021-01-11 MED ORDER — SODIUM CHLORIDE 0.9 % IV SOLN
2.0000 g | Freq: Once | INTRAVENOUS | Status: AC
  Administered 2021-01-11: 2 g via INTRAVENOUS
  Filled 2021-01-11: qty 2000

## 2021-01-11 SURGICAL SUPPLY — 9 items
BNDG GAUZE ELAST 4 BULKY (GAUZE/BANDAGES/DRESSINGS) ×2 IMPLANT
COUNTER NEEDLE 20/40 LG (NEEDLE) ×2 IMPLANT
DRAPE UNDER BUTTOCK W/FLU (DRAPES) ×2 IMPLANT
GAUZE 4X4 16PLY ~~LOC~~+RFID DBL (SPONGE) ×6 IMPLANT
GAUZE PACKING FOLDED 1IN STRL (GAUZE/BANDAGES/DRESSINGS) ×2 IMPLANT
LEGGING LITHOTOMY PAIR STRL (DRAPES) ×2 IMPLANT
PACK BASIN MINOR ARMC (MISCELLANEOUS) ×2 IMPLANT
SUT VIC AB 2-0 CT1 (SUTURE) ×8 IMPLANT
SUT VIC AB 2-0 CT1 18 (SUTURE) ×2 IMPLANT
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
## 2021-01-11 NOTE — Anesthesia Preprocedure Evaluation (Signed)
Anesthesia Evaluation  Patient identified by MRN, date of birth, ID band Patient awake  General Assessment Comment:  Immediately post partum, labial hematoma.  Hemodynamically stable  Reviewed: Allergy & Precautions, NPO status , Patient's Chart, lab work & pertinent test results  History of Anesthesia Complications Negative for: history of anesthetic complications  Airway Mallampati: II  TM Distance: >3 FB Neck ROM: Full    Dental  (+) Poor Dentition, Chipped   Pulmonary neg pulmonary ROS, neg sleep apnea, neg COPD, Patient abstained from smoking.Not current smoker,    Pulmonary exam normal breath sounds clear to auscultation       Cardiovascular Exercise Tolerance: Good METShypertension, (-) CAD and (-) Past MI (-) dysrhythmias  Rhythm:Regular Rate:Normal - Systolic murmurs    Neuro/Psych PSYCHIATRIC DISORDERS negative neurological ROS     GI/Hepatic neg GERD  ,(+)     (-) substance abuse  ,   Endo/Other  neg diabetes  Renal/GU negative Renal ROS     Musculoskeletal   Abdominal   Peds  Hematology  (+) Blood dyscrasia, anemia ,   Anesthesia Other Findings Past Medical History: No date: Anemia 04/15/2018: Anemia, borderline No date: Infection     Comment:  History of bladder infections No date: Mental disorder No date: Pregnancy induced hypertension     Comment:  5th pregnancy, per patient No date: Preterm labor No date: Teeth problem     Comment:  Per patient, "bad teeth" No date: Vaginal Pap smear, abnormal  Reproductive/Obstetrics                             Anesthesia Physical Anesthesia Plan  ASA: 2 and emergent  Anesthesia Plan: General   Post-op Pain Management:    Induction: Intravenous and Rapid sequence  PONV Risk Score and Plan: 3 and Ondansetron and Dexamethasone  Airway Management Planned: Oral ETT  Additional Equipment: None  Intra-op Plan:    Post-operative Plan: Extubation in OR  Informed Consent: I have reviewed the patients History and Physical, chart, labs and discussed the procedure including the risks, benefits and alternatives for the proposed anesthesia with the patient or authorized representative who has indicated his/her understanding and acceptance.     Dental advisory given  Plan Discussed with: CRNA and Surgeon  Anesthesia Plan Comments: (Discussed risks of anesthesia with patient, including PONV, sore throat, lip/dental damage. Rare risks discussed as well, such as cardiorespiratory and neurological sequelae, and allergic reactions. Patient understands.)        Anesthesia Quick Evaluation

## 2021-01-11 NOTE — OB Triage Note (Signed)
Patient discharged home, ambulatory in good condition. Reviewed discharge instructions, concerns and questions answered and patient verbalizes understanding.

## 2021-01-11 NOTE — Progress Notes (Signed)
Patient ID: Toni Black, female   DOB: 18-Feb-1986, 35 y.o.   MRN: [id number] In attendance for SVD by Chari Manning at [phone]  With last push a large right vulvar hematoma formed . Pressure applied while placenta was delivered. Hematoma estimated at 8x 5 cm in size . I have consented the patient for evacuation of a right vulvar hematoma .

## 2021-01-11 NOTE — Brief Op Note (Signed)
01/11/2021  8:59 PM  PATIENT:  Louie Casa  35 y.o. female  PRE-OPERATIVE DIAGNOSIS:  Vulvar hematoma  POST-OPERATIVE DIAGNOSIS:  Vulvar hematoma  PROCEDURE:  evacuation of right vulvar hematoma SURGEON:  Surgeon(s) and Role: Chardonay Scritchfield, Ihor Austin, MD - Primary  PHYSICIAN ASSISTANT: Rubye Oaks , CNM   ASSISTANTS: none   ANESTHESIA:   general  EBL:  400 mL IOF 500 cc OU 100 cc  BLOOD ADMINISTERED:none  DRAINS: Urinary Catheter (Foley)   LOCAL MEDICATIONS USED:  NONE  SPECIMEN:  No Specimen  DISPOSITION OF SPECIMEN:  N/A  COUNTS:  YES  TOURNIQUET:  * No tourniquets in log *  DICTATION: .Other Dictation: Dictation Number    PLAN OF CARE:  already admitted   PATIENT DISPOSITION:  PACU - hemodynamically stable.   Delay start of Pharmacological VTE agent (>24hrs) due to surgical blood loss or risk of bleeding: not applicable

## 2021-01-11 NOTE — Transfer of Care (Signed)
Immediate Anesthesia Transfer of Care Note  Patient: Toni Black  Procedure(s) Performed: EVACUATION OF VULVAR HEMATOMA; REPAIR OF LACERATION  Patient Location: PACU  Anesthesia Type:General  Level of Consciousness: drowsy and patient cooperative  Airway & Oxygen Therapy: Patient Spontanous Breathing and Patient connected to nasal cannula oxygen  Post-op Assessment: Report given to RN and Post -op Vital signs reviewed and stable  Post vital signs: Reviewed and stable  Last Vitals:  Vitals Value Taken Time  BP 111/80 01/11/21 2117  Temp    Pulse 119 01/11/21 2120  Resp 20 01/11/21 2117  SpO2 100 % 01/11/21 2120  Vitals shown include unvalidated device data.  Last Pain:  Vitals:   01/11/21 1750  TempSrc: Oral  PainSc:       Patients Stated Pain Goal: 0 (01/11/21 1501)  Complications: No notable events documented.

## 2021-01-11 NOTE — OB Triage Note (Signed)
Patient is G6P5, [redacted]w[redacted]d that presents with complaints of lower extremity swelling that patient states started yesterday. Patient has had some contractions since yesterday, they have eased off in intensity. Patient rates her contraction pain 5/10. No headache. No vision issues. No bleeding. FM+. VSS,. Monitors applied and assessing.
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
## 2021-01-11 NOTE — Discharge Summary (Signed)
Toni Black is a 35 y.o. female. She is at 72w6dgestation. Patient's last menstrual period was 04/14/2020 (exact date). Estimated Date of Delivery: 01/19/21  Prenatal care site: [facility]  Chief complaint: swelling  HPI: JTuanapresents to L&D with complaints of Lower extremity swelling  Factors complicating pregnancy: Abnormal pap smear  Grand multiparity  Obesity in pregnancy  Uterine Size/Dates discrepancy - EFW 48% History of IPV   S: Resting comfortably. no CTX, no VB.no LOF,  Active fetal movement.   Maternal Medical History:  Past Medical Hx:  has a past medical history of Anemia, Anemia, borderline (04/15/2018), Infection, Mental disorder, Pregnancy induced hypertension, Preterm labor, Teeth problem, and Vaginal Pap smear, abnormal.    Past Surgical Hx:  has a past surgical history that includes No past surgeries and Denies surgical history.   No Known Allergies   Prior to Admission medications   Medication Sig Start Date End Date Taking? Authorizing Provider  ferrous sulfate 325 (65 FE) MG tablet Take 1 tablet (325 mg total) by mouth daily with breakfast. 10/20/20  Yes NCaren Macadam MD  Prenatal Vit-Fe Fumarate-FA (PRENATAL MULTIVITAMIN) TABS tablet Take 1 tablet by mouth daily at 12 noon. 09/29/20  Yes Streilein, ARalene Bathe PA-C    Social History: She  reports that she has never smoked. She has never used smokeless tobacco. She reports that she does not drink alcohol and does not use drugs.  Family History: family history includes Arthritis in her maternal aunt; Asthma in her son; Diabetes in her maternal aunt; Fibroids in her mother; Hypertension in her father and mother; Leukemia in her maternal grandmother; Migraines in her father and mother; Throat cancer in her maternal grandfather. ,no history of gyn cancers  Review of Systems: A full review of systems was performed and negative except as noted in the HPI.    O:  BP (!) 142/90 (BP Location: Right Arm)    Pulse 91   Temp 98.6 F (37 C) (Oral)   Resp 18   Ht _0  (1.575 m)   Wt 76.3 kg   LMP 04/14/2020 (Exact Date)   BMI 30.78 kg/m  Results for orders placed or performed during the hospital encounter of 01/11/21 (from the past 48 hour(s))  Protein / creatinine ratio, urine   Collection Time: 01/11/21  8:53 AM  Result Value Ref Range   Creatinine, Urine 82 mg/dL   Total Protein, Urine 9 mg/dL   Protein Creatinine Ratio 0.11 0.00 - 0.15 mg/mg[Cre]  Results for orders placed or performed in visit on 01/10/21 (from the past 48 hour(s))  PIH Panel ([facility] [id number]   Collection Time: 01/10/21  9:40 AM  Result Value Ref Range   Uric Acid 6.1 2.6 - 6.2 mg/dL   BUN 5 (L) 6 - 20 mg/dL   Creatinine, Ser 0.78 0.57 - 1.00 mg/dL   eGFR 102 >59 mL/min/1.73   LDH 191 119 - 226 IU/L   AST 15 0 - 40 IU/L   Hemoglobin 9.1 (L) 11.1 - 15.9 g/dL   Hematocrit 28.9 (L) 34.0 - 46.6 %   Platelets 259 150 - 450 x10E3/uL  Urinalysis (Urine Dip)   Collection Time: 01/10/21  1:25 PM  Result Value Ref Range   Specific Gravity, UA 1.020 1.005 - 1.030   pH, UA 6.0 5.0 - 7.5   Color, UA Yellow Yellow   Appearance Ur Clear Clear   Leukocytes,UA 1+ (A) Negative   Protein,UA Trace Negative/Trace   Glucose, UA Negative Negative  Ketones, UA Negative Negative   RBC, UA Negative Negative   Bilirubin, UA Negative Negative   Urobilinogen, Ur 1.0 0.2 - 1.0 mg/dL   Nitrite, UA Negative Negative      Constitutional: NAD, AAOx3  HE/ENT: extraocular movements grossly intact, moist mucous membranes CV: RRR PULM: nl respiratory effort, CTABL Abd: gravid, non-tender, non-distended, soft  Ext: Non-tender, Nonedmeatous Psych: mood appropriate, speech normal SVE: Dilation: 5 Effacement (%): 60 Cervical Position: Posterior, Middle Station: -2 Presentation: Vertex Exam by:: Pia Mau, RN   Fetal Monitor: Baseline: 135 bpm Variability: moderate Accels: Present Decels: none Toco: irregular, every  1-10 minutes  Category: I   Assessment: 35 y.o. 15w6dhere for antenatal surveillance during pregnancy.  Principle diagnosis:  The primary encounter diagnosis was Significant discrepancy between uterine size and clinical dates, antepartum. Diagnoses of Anemia affecting pregnancy in third trimester and Group beta Strep positive were also pertinent to this visit.   Plan: Labor: not present.  Fetal Wellbeing: Reassuring Cat 1 tracing. Reactive NST  D/c home stable, precautions reviewed, follow-up as scheduled.  Labs drawn to evaluate preeclampsia Labs WNL Recommended patient to stay for augmentation of labor d/t advanced dilatation and mild range bp Patient opted to d/c home to return vehicle to FOB d/t transporting self to hospital, patient states she will come back.  ----- FAvelino LeedsCNM Certified Nurse Midwife [facility]

## 2021-01-11 NOTE — Anesthesia Procedure Notes (Addendum)
Procedure Name: Intubation Date/Time: 01/11/2021 8:06 PM Performed by: Jonna Clark, CRNA Pre-anesthesia Checklist: Patient identified, Patient being monitored, Timeout performed, Emergency Drugs available and Suction available Patient Re-evaluated:Patient Re-evaluated prior to induction Oxygen Delivery Method: Circle system utilized Preoxygenation: Pre-oxygenation with 100% oxygen Induction Type: IV induction and Rapid sequence Ventilation: Mask ventilation without difficulty Laryngoscope Size: Mac and 3 Grade View: Grade I Tube type: Oral Tube size: 6.5 mm Number of attempts: 1 Airway Equipment and Method: Stylet Placement Confirmation: ETT inserted through vocal cords under direct vision, positive ETCO2 and breath sounds checked- equal and bilateral Secured at: 21 cm Tube secured with: Tape Dental Injury: Teeth and Oropharynx as per pre-operative assessment  Comments: Modified RSI, a few gentle ventilations after induction. Unremarkable.

## 2021-01-11 NOTE — Progress Notes (Signed)
Patient ID: Toni Black, female   DOB: March 09, 1986, 35 y.o.   MRN: [id number] Given the chance that vulvar hematoma maybe expanding internally and the patient is already anemic I have ordered one unit packed RBC for transfusion . Also I have instructed the RN to place a second IV line   The case has been  posted and  and we should be going back shortly

## 2021-01-12 ENCOUNTER — Encounter: Payer: Self-pay | Admitting: Obstetrics and Gynecology

## 2021-01-12 LAB — CBC
HCT: 26.3 % — ABNORMAL LOW (ref 36.0–46.0)
HCT: 26.4 % — ABNORMAL LOW (ref 36.0–46.0)
Hemoglobin: 8 g/dL — ABNORMAL LOW (ref 12.0–15.0)
Hemoglobin: 8.3 g/dL — ABNORMAL LOW (ref 12.0–15.0)
MCH: 22.7 pg — ABNORMAL LOW (ref 26.0–34.0)
MCH: 23.6 pg — ABNORMAL LOW (ref 26.0–34.0)
MCHC: 30.4 g/dL (ref 30.0–36.0)
MCHC: 31.4 g/dL (ref 30.0–36.0)
MCV: 74.7 fL — ABNORMAL LOW (ref 80.0–100.0)
MCV: 75.2 fL — ABNORMAL LOW (ref 80.0–100.0)
Platelets: 230 10*3/uL (ref 150–400)
Platelets: 242 10*3/uL (ref 150–400)
RBC: 3.51 MIL/uL — ABNORMAL LOW (ref 3.87–5.11)
RBC: 3.52 MIL/uL — ABNORMAL LOW (ref 3.87–5.11)
RDW: 15.8 % — ABNORMAL HIGH (ref 11.5–15.5)
RDW: 15.9 % — ABNORMAL HIGH (ref 11.5–15.5)
WBC: 19.6 10*3/uL — ABNORMAL HIGH (ref 4.0–10.5)
WBC: 19.9 10*3/uL — ABNORMAL HIGH (ref 4.0–10.5)
nRBC: 0 % (ref 0.0–0.2)
nRBC: 0 % (ref 0.0–0.2)

## 2021-01-12 LAB — RPR: RPR Ser Ql: NONREACTIVE

## 2021-01-12 MED ORDER — MEDROXYPROGESTERONE ACETATE 150 MG/ML IM SUSP
150.0000 mg | INTRAMUSCULAR | Status: AC | PRN
  Administered 2021-01-13: 150 mg via INTRAMUSCULAR
  Filled 2021-01-12 (×2): qty 1

## 2021-01-12 MED ORDER — ADULT MULTIVITAMIN LIQUID CH
15.0000 mL | Freq: Every day | ORAL | Status: DC
  Administered 2021-01-12 – 2021-01-13 (×2): 15 mL via ORAL
  Filled 2021-01-12 (×2): qty 15

## 2021-01-12 MED ORDER — IBUPROFEN 100 MG/5ML PO SUSP
600.0000 mg | Freq: Four times a day (QID) | ORAL | Status: DC
  Administered 2021-01-12 – 2021-01-13 (×4): 600 mg via ORAL
  Filled 2021-01-12 (×8): qty 30

## 2021-01-12 MED ORDER — FERROUS SULFATE 75 (15 FE) MG/ML PO SOLN
300.0000 mg | Freq: Two times a day (BID) | ORAL | Status: DC
  Administered 2021-01-12 – 2021-01-13 (×3): 300 mg via ORAL
  Filled 2021-01-12 (×4): qty 4

## 2021-01-12 NOTE — Progress Notes (Signed)
Called lab at 2348 to remind them of 2330 timed CBC. Was told there was "one lab tech and she's probably behind" lab to room 337 to draw CBC at 0007.

## 2021-01-12 NOTE — Progress Notes (Signed)
Post Partum Day 1 Subjective: Doing well, no complaints.  Tolerating regular diet, pain with PO meds, voiding and ambulating without difficulty.  No CP SOB Fever,Chills, N/V or leg pain; denies nipple or breast pain, no HA change of vision, RUQ/epigastric pain  Objective: BP 118/76 (BP Location: Right Arm)   Pulse 89   Temp 99.1 F (37.3 C) (Oral)   Resp 17   Ht 5\' 2"  (1.575 m)   Wt 76.3 kg   LMP 04/14/2020 (Exact Date)   SpO2 100%   Breastfeeding Unknown   BMI 30.77 kg/m    Physical Exam:  General: NAD Breasts: soft/nontender, no nipple abrasions/excoriation/bruising not engorgement, warm to touch, filling/firm CV: RRR Pulm: nl effort, CTABL Abdomen: soft, NT, BS x 4 Perineum: minimal edema, lacerations hemostatic, repair well approximated. Lochia: moderate Uterine Fundus: fundus firm and 1 fb below umbilicus DVT Evaluation: no cords, ttp LEs   Recent Labs    01/12/21 0010 01/12/21 0419  HGB 8.3* 8.0*  HCT 26.4* 26.3*  WBC 19.6* 19.9*  PLT 230 242    Assessment/Plan: 35 y.o. 20 postpartum day # 1  - Continue routine PP care - Lactation consult, encouraged snug fitting bra and cabbage leaves for bottlefeeding.  - Discussed contraceptive options including implant, IUDs hormonal and non-hormonal, injection, pills/ring/patch, condoms, and NFP.  -  Chronic iron deficiency anemia - hemodynamically stable and asymptomatic; CBC reviewed overnight, received 1u PRBC overnight d/t hematoma evacuation, start liquid ferrous sulfate BID with stool softeners  - Immunization status: Needs all Imms up to date - Contraception: BTL (RN verifying if BTL paperwork was signed prenatally), Depo (ordered for discharge) - Infant feeding: Formula  Disposition: Does not desire Dc home today.   [id number], CNM Donato Schultz, CNM 01/12/2021 8:37 AM

## 2021-01-12 NOTE — Anesthesia Postprocedure Evaluation (Signed)
Anesthesia Post Note  Patient: Toni Black  Procedure(s) Performed: EVACUATION OF VULVAR HEMATOMA; REPAIR OF LACERATION  Patient location during evaluation: PACU Anesthesia Type: General Level of consciousness: awake and alert Pain management: pain level controlled Vital Signs Assessment: post-procedure vital signs reviewed and stable Respiratory status: spontaneous breathing, nonlabored ventilation, respiratory function stable and patient connected to nasal cannula oxygen Cardiovascular status: blood pressure returned to baseline and stable Postop Assessment: no apparent nausea or vomiting Anesthetic complications: no   No notable events documented.   Last Vitals:  Vitals:   01/11/21 2300 01/12/21 0300  BP: 130/90 128/81  Pulse: 90 79  Resp: 18 18  Temp: 36.7 C 37.3 C  SpO2: 99% 100%    Last Pain:  Vitals:   01/12/21 0300  TempSrc: Oral  PainSc: 4                  Corinda Gubler

## 2021-01-12 NOTE — Progress Notes (Signed)
Vaginal packing removed.  Labia swollen, sutures intact. Fundus is firm, bleeding is small. Ice pack to perineum, patient rates pain 5/10, but declined pain meds at this time. Foley remains in place and patent.   Reznor Ferrando LUCY Delmar Landau, CNM

## 2021-01-12 NOTE — Op Note (Signed)
NAME: Toni Black, TWEED MEDICAL RECORD NO: [id number] ACCOUNT NO: [id number] DATE OF BIRTH: [birth year] FACILITY: [facility] LOCATION: [facility] PHYSICIAN: Suzy Bouchard, MD  Operative Report   DATE OF PROCEDURE: 01/11/2021  PREOPERATIVE DIAGNOSIS:  Right vulvar hematoma, status post spontaneous vaginal delivery.  POSTOPERATIVE DIAGNOSIS:  Right vulvar hematoma, status post spontaneous vaginal delivery.  PROCEDURE:  Evacuation of right vulvar hematoma and repair.  ANESTHESIA:  General endotracheal anesthesia.  SURGEON:  Suzy Bouchard, MD.  FIRST ASSISTANT:  Rubye Oaks, certified nurse midwife.  INDICATIONS:  A 35 year old gravida 6, para 5 at 38+6 weeks underwent an uncomplicated spontaneous vaginal delivery.  Within the late second stage of delivery, a right vulvar hematoma was noticed and developed.  After the fetus was delivered, this  hematoma expanded.  So, given the size of the hematoma, it was elected that the patient should have this evacuated in the operating room.  DESCRIPTION OF PROCEDURE:  After adequate general endotracheal anesthesia, the patient was placed in dorsal supine position with the legs in candy cane stirrups.  The patient's perineum and vagina were prepped and draped in normal sterile fashion.  The  patient did receive 2 grams of IV Ancef for surgical prophylaxis.  Foley catheter had been previously placed.  Timeout was performed.  The right vulvar hematoma was measured at 14 longitudinally and 9 cm laterally.  A 7 cm incision was made in the inner  aspect of the right vulvar hematoma and large clot was evacuated.  Deep tissues were then visualized and multiple 2-0 Vicryl sutures were placed to control oozing.  Dead space was closed with additional suturing and ultimately the incision site was  closed with interrupted 2-0 Vicryl suture.  Good hemostasis was noted at the end of the case.  Estimated evacuation of blood clot and oozing 400 mL. The patient's  vagina was then packed with a Kerlix roll soaked with Sultrin cream.  The patient remained  stable throughout the procedure.  A vaginal exam revealed no additional hematoma deeper into the sidewall.  Procedure was terminated.  ESTIMATED BLOOD LOSS:  400 mL  INTRAOPERATIVE FLUIDS:  500 mL  URINE OUTPUT:  100 mL  There were no complications.  She was taken to recovery room in good condition.   PAA D: 01/11/2021 9:19:54 pm T: 01/12/2021 2:16:00 am  JOB: [id number]/ [id number]

## 2021-01-13 MED ORDER — IBUPROFEN 100 MG/5ML PO SUSP: 600.0000 mg | Freq: Four times a day (QID) | ORAL | Status: DC

## 2021-01-13 MED ORDER — FERROUS SULFATE 75 (15 FE) MG/ML PO SOLN: 300.0000 mg | Freq: Two times a day (BID) | ORAL | 3 refills | Status: DC

## 2021-01-13 NOTE — Progress Notes (Addendum)
Patient discharged with infant. Discharge instructions, prescriptions, and follow up appointments given to and reviewed with patient. Patient verbalized understanding. Patient given note stating she was in the hospital. Will be escorted out by axillary.

## 2021-01-13 NOTE — Discharge Instructions (Signed)
Vaginal Delivery, Care After Refer to this sheet in the next few weeks. These discharge instructions provide you with information on caring for yourself after delivery. Your caregiver may also give you specific instructions. Your treatment has been planned according to the most current medical practices available, but problems sometimes occur. Call your caregiver if you have any problems or questions after you go home. HOME CARE INSTRUCTIONS Take over-the-counter or prescription medicines only as directed by your caregiver or pharmacist. Do not drink alcohol, especially if you are breastfeeding or taking medicine to relieve pain. Do not smoke tobacco. Continue to use good perineal care. Good perineal care includes: Wiping your perineum from back to front Keeping your perineum clean. You can do sitz baths twice a day, to help keep this area clean Do not use tampons, douche or have sex until your caregiver says it is okay. Shower only and avoid sitting in submerged water, aside from sitz baths Wear a well-fitting bra that provides breast support. Eat healthy foods. Drink enough fluids to keep your urine clear or pale yellow. Eat high-fiber foods such as whole grain cereals and breads, brown rice, beans, and fresh fruits and vegetables every day. These foods may help prevent or relieve constipation. Avoid constipation with high fiber foods or medications, such as miralax or metamucil Follow your caregiver's recommendations regarding resumption of activities such as climbing stairs, driving, lifting, exercising, or traveling. Talk to your caregiver about resuming sexual activities. Resumption of sexual activities is dependent upon your risk of infection, your rate of healing, and your comfort and desire to resume sexual activity. Try to have someone help you with your household activities and your newborn for at least a few days after you leave the hospital. Rest as much as possible. Try to rest or  take a nap when your newborn is sleeping. Increase your activities gradually. Keep all of your scheduled postpartum appointments. It is very important to keep your scheduled follow-up appointments. At these appointments, your caregiver will be checking to make sure that you are healing physically and emotionally. SEEK MEDICAL CARE IF:  You are passing large clots from your vagina. Save any clots to show your caregiver. You have a foul smelling discharge from your vagina. You have trouble urinating. You are urinating frequently. You have pain when you urinate. You have a change in your bowel movements. You have increasing redness, pain, or swelling near your vaginal incision (episiotomy) or vaginal tear. You have pus draining from your episiotomy or vaginal tear. Your episiotomy or vaginal tear is separating. You have painful, hard, or reddened breasts. You have a severe headache. You have blurred vision or see spots. You feel sad or depressed. You have thoughts of hurting yourself or your newborn. You have questions about your care, the care of your newborn, or medicines. You are dizzy or light-headed. You have a rash. You have nausea or vomiting. You were breastfeeding and have not had a menstrual period within 12 weeks after you stopped breastfeeding. You are not breastfeeding and have not had a menstrual period by the 12th week after delivery. You have a fever. SEEK IMMEDIATE MEDICAL CARE IF:  You have persistent pain. You have chest pain. You have shortness of breath. You faint. You have leg pain. You have stomach pain. Your vaginal bleeding saturates two or more sanitary pads in 1 hour. MAKE SURE YOU:  Understand these instructions. Will watch your condition. Will get help right away if you are not doing well or   get worse. Document Released: 04/20/2000 Document Revised: 09/07/2013 Document Reviewed: 12/19/2011 ExitCare Patient Information 2015 [employer]. This  information is not intended to replace advice given to you by your health care provider. Make sure you discuss any questions you have with your health care provider.  Sitz Bath A sitz bath is a warm water bath taken in the sitting position. The water covers only the hips and butt (buttocks). We recommend using one that fits in the toilet, to help with ease of use and cleanliness. It may be used for either healing or cleaning purposes. Sitz baths are also used to relieve pain, itching, or muscle tightening (spasms). The water may contain medicine. Moist heat will help you heal and relax.  HOME CARE  Take 3 to 4 sitz baths a day. Fill the bathtub half-full with warm water. Sit in the water and open the drain a little. Turn on the warm water to keep the tub half-full. Keep the water running constantly. Soak in the water for 15 to 20 minutes. After the sitz bath, pat the affected area dry. GET HELP RIGHT AWAY IF: You get worse instead of better. Stop the sitz baths if you get worse. MAKE SURE YOU: Understand these instructions. Will watch your condition. Will get help right away if you are not doing well or get worse. Document Released: 05/31/2004 Document Revised: 01/16/2012 Document Reviewed: 08/21/2010 ExitCare Patient Information 2015 [employer]. This information is not intended to replace advice given to you by your health care provider. Make sure you discuss any questions you have with your health care provider.   

## 2021-01-15 LAB — TYPE AND SCREEN
ABO/RH(D): O POS
Antibody Screen: NEGATIVE
Unit division: 0

## 2021-01-15 LAB — BPAM RBC
Blood Product Expiration Date: 202210102359
ISSUE DATE / TIME: 202209072046
Unit Type and Rh: 5100

## 2021-01-17 ENCOUNTER — Ambulatory Visit: Payer: Medicaid Other

## 2021-02-23 ENCOUNTER — Telehealth: Payer: Self-pay

## 2021-02-23 ENCOUNTER — Ambulatory Visit: Payer: Medicaid Other

## 2021-02-23 NOTE — Telephone Encounter (Signed)
Call to client to reschedule post-partum appt as [facility] provider not available due to illness. Client requested appt 03/02/2021 and appt scheduled (to arrive at 1000). Jossie Ng, RN

## 2021-03-02 ENCOUNTER — Ambulatory Visit: Payer: Medicaid Other | Admitting: Physician Assistant

## 2021-03-02 ENCOUNTER — Other Ambulatory Visit: Payer: Self-pay

## 2021-03-02 ENCOUNTER — Encounter: Payer: Self-pay | Admitting: Physician Assistant

## 2021-03-02 VITALS — BP 116/76 | Ht 62.0 in | Wt 152.2 lb

## 2021-03-02 DIAGNOSIS — Z3009 Encounter for other general counseling and advice on contraception: Secondary | ICD-10-CM

## 2021-03-02 DIAGNOSIS — D649 Anemia, unspecified: Secondary | ICD-10-CM | POA: Diagnosis not present

## 2021-03-02 LAB — HEMOGLOBIN, FINGERSTICK: Hemoglobin: 9.7 g/dL — ABNORMAL LOW (ref 11.1–15.9)

## 2021-03-02 MED ORDER — MEDROXYPROGESTERONE ACETATE 150 MG/ML IM SUSP: 150.0000 mg | INTRAMUSCULAR | Status: DC

## 2021-03-02 NOTE — Progress Notes (Addendum)
Hgb = 9.7. Reviewed with provider, A. Streilein. Counseled to take liquid iron 3x/day by provider, and anemia pamphlet given. Patient given next Depo card and will need Hgb checked at that appointment, per provider. Depo consent signed today.Marland Kitchen Marland KitchenBurt Knack, RN

## 2021-03-02 NOTE — Progress Notes (Signed)
Patient here for PP visit. SVD 01/11/2021, received Depo in hospital on 01/13/2021. Next Depo due around 04/03/2021, patient aware. Needs Hgb check today.Burt Knack, RN

## 2021-03-02 NOTE — Progress Notes (Signed)
TC to patient to schedule her next Depo appointment. Scheduled in [facility] appointment due to need for Hgb check and evaluation at that appointment. Scheduled for 04/03/21.Marland KitchenBurt Knack, RN

## 2021-03-02 NOTE — Progress Notes (Signed)
Post Partum Exam  Toni Black is a 35 y.o. [phone] female who presents for a postpartum visit. She is 7 weeks postpartum following a spontaneous vaginal delivery. I have fully reviewed the prenatal and intrapartum course. The delivery was at 38 6/7 gestational weeks.  Anesthesia: none. Postpartum course has been complicated. Baby's course has been uneventful. Baby is feeding by Bottle Bleeding no bleeding. Bowel function is normal. Bladder function is normal. Patient is not sexually active. Contraception method is Depo-Provera injections.   Postpartum depression screening:  Edinburgh Postnatal Depression Scale - 03/02/21 1103       Edinburgh Postnatal Depression Scale:  In the Past 7 Days   I have been able to laugh and see the funny side of things. 0    I have looked forward with enjoyment to things. 0    I have blamed myself unnecessarily when things went wrong. 0    I have been anxious or worried for no good reason. 0    I have felt scared or panicky for no good reason. 0    Things have been getting on top of me. 0    I have been so unhappy that I have had difficulty sleeping. 0    I have felt sad or miserable. 0    I have been so unhappy that I have been crying. 0    The thought of harming myself has occurred to me. 0    Edinburgh Postnatal Depression Scale Total 0              The following portions of the patient's history were reviewed and updated as appropriate: allergies, current medications, past family history, past medical history, past social history, past surgical history, and problem list. Last pap smear done 07/07/20 and was Normal  Review of Systems Neurological: positive for headaches   All other systems negative.  Objective:  BP 116/76   Ht 5\' 2"  (1.575 m)   Wt 152 lb 3.2 oz (69 kg)   LMP  (LMP Unknown) Comment: LMP before pregnancy  Breastfeeding No   BMI 27.84 kg/m   Gen: well appearing, NAD HEENT: no scleral icterus CV: RR Lung: Normal  WOB Breast:performed-yes  Ext: warm well perfused  GU: Fine well-healed scar along margin of L labia majora, no tenderness, warmth or swelling. Vagina without discharge, erythema or tenderness, cervix parous. Uterus 6 wk size, nontender, no adnexal tenderness or mass. Rectal: performed -  not indicated       Assessment:    Normal postpartum exam with well-healed area of labial hematoma evacuation with laceration repair. Pap smear not done at today's visit.   Plan:   Essential components of care per ACOG recommendations for Comprehensive Postpartum exam:  1.  Mood and well being: Patient with negative depression screening today. Reviewed local resources for support. EPDS is low risk. Reviewed resources and that mood sx in first year after pregnancy are considered related to pregnancy and to reach out for help at [facility] if needed. Discussed [facility] as link to care and availability of LCSW for counseling  - Patient does not use tobacco. If using tobacco we discussed reduction and for recently cessation risk of relapse - hx of drug use? No   If yes, discussed support systems in place  2. Infant care and feeding:  -Patient currently breastmilk feeding? No If breastmilk feeding discussed return to work and pumping. If needed, patient was provided letter for work to allow for every 2-3 hr  pumping breaks, and to be granted a private location to express breastmilk and refrigerated area to store breastmilk. Reviewed importance of draining breast regularly to support lactation. I  -Recommended patient engage with WIC/BFpeer counselors  -Counseled to sign new child up for [facility] services -Social determinants of health (SDOH) reviewed in EPIC. No concerns.  3. Sexuality, contraception and birth spacing  Contraception: Contraception counseling: Reviewed all forms of birth control options in the tiered based approach. available including abstinence; over the counter/barrier methods; hormonal contraceptive  medication including pill, patch, ring, injection,contraceptive implant; hormonal and nonhormonal IUDs; permanent sterilization options including vasectomy and the various tubal sterilization modalities. Risks, benefits, and typical effectiveness rates were reviewed.  Questions were answered.  Written information was also given to the patient to review.  Patient desires DMPA, this was prescribed for patient. She will follow up in  6 weeks. for surveillance.  She was told to call with any further questions, or with any concerns about this method of contraception.  Emphasized use of condoms 100% of the time for STI prevention.   - Patient does not want a pregnancy in the next year.  Desired family size is 6 children.  - Reviewed forms of contraception in tiered fashion. Patient desired Depo-Provera today.   - Discussed birth spacing of 18 months  4. Sleep and fatigue -Encouraged family/partner/community support of 4 hrs of uninterrupted sleep to help with mood and fatigue  5. Physical Recovery  - Discussed patients delivery, labial hematoma/evacuation/laceration repair and recovery. - Patient has urinary incontinence? No  - Patient is safe to resume physical and sexual activity  6.  Health Maintenance/Chronic Disease Health Maintenance Due  Topic Date Due   COVID-19 Vaccine (1) Never done   INFLUENZA VACCINE  Never done    - Last pap smear performed 07/07/20 and was normal with negative HPV. Does not need Mammogram  1. Family planning services Continue DMPA, next injection due in about 6 weeks. - medroxyPROGESTERone (DEPO-PROVERA) injection 150 mg  2. Postpartum exam Normal exam. - Hemoglobin, fingerstick  3. Anemia, unspecified type Anemia improved from 8.5 g/dl, but still needs    Patient given handout about PCP care in the community Given MVI per family planning program guidelines and availability  Follow up in: 6 weeks or as needed.

## 2021-04-03 ENCOUNTER — Other Ambulatory Visit: Payer: Self-pay

## 2021-04-03 ENCOUNTER — Ambulatory Visit (LOCAL_COMMUNITY_HEALTH_CENTER): Payer: Medicaid Other | Admitting: Advanced Practice Midwife

## 2021-04-03 VITALS — BP 124/76 | HR 108 | Temp 97.4°F | Ht 62.0 in | Wt 152.8 lb

## 2021-04-03 DIAGNOSIS — Z3042 Encounter for surveillance of injectable contraceptive: Secondary | ICD-10-CM | POA: Diagnosis not present

## 2021-04-03 DIAGNOSIS — Z3009 Encounter for other general counseling and advice on contraception: Secondary | ICD-10-CM

## 2021-04-03 MED ORDER — MEDROXYPROGESTERONE ACETATE 150 MG/ML IM SUSP
150.0000 mg | INTRAMUSCULAR | Status: AC
Start: 2021-04-03 — End: 2021-12-04
  Administered 2021-04-03 – 2021-12-04 (×4): 150 mg via INTRAMUSCULAR

## 2021-04-03 NOTE — Progress Notes (Signed)
Pt here for DMPA. She had a pp visit done by Kristen Loader, PA on 03/02/21. Order corrected to be DMPA 150 mg IM q 11-13 wks x 1 year (first DMPA due 04/03/21 because DMPA given immediately pp in hospital)

## 2021-04-03 NOTE — Progress Notes (Signed)
DMPA 150 mg given IM today (Right Deltoid) per verbal order today by Arnetha Courser, CNM and tolerated well.  Patient is 11 w 3 d post last Depo. Hart Carwin, RN

## 2021-04-10 ENCOUNTER — Telehealth: Payer: Self-pay | Admitting: Family Medicine

## 2021-04-10 NOTE — Telephone Encounter (Signed)
Has a questions about a maternity leave form she needs filled out for her job. Please call.

## 2021-04-10 NOTE — Telephone Encounter (Signed)
Call to client and left message that forms not yet completed and would send message to [facility] providers to try to get completed ASAP. Secure chat sent to [facility] clients. Forms are in provider to do box in [facility] provider workroom. Jossie Ng, RN

## 2021-04-11 NOTE — Telephone Encounter (Signed)
Dian Queen retrieved client's return to work forms to take to her office stating just spoke with client who states enroute from [place] to pick them up. Jossie Ng, RN

## 2021-04-11 NOTE — Telephone Encounter (Signed)
Correction to note dated 04/10/2021 - secure chat sent to [facility] providers (not clients as documented). Return to work form completed by Hazle Coca CNM and call to client to notify ready for pick-up. Left message with above information and that available for pick up in [facility]. Call to emergency contact to notify about above and per recorded message, voice mail box is full. Jossie Ng, RN

## 2021-06-21 ENCOUNTER — Ambulatory Visit (LOCAL_COMMUNITY_HEALTH_CENTER): Payer: Medicaid Other

## 2021-06-21 ENCOUNTER — Other Ambulatory Visit: Payer: Self-pay

## 2021-06-21 VITALS — BP 126/86 | Ht 62.0 in | Wt 160.5 lb

## 2021-06-21 DIAGNOSIS — Z3009 Encounter for other general counseling and advice on contraception: Secondary | ICD-10-CM

## 2021-06-21 DIAGNOSIS — Z3042 Encounter for surveillance of injectable contraceptive: Secondary | ICD-10-CM | POA: Diagnosis not present

## 2021-06-21 NOTE — Progress Notes (Signed)
11 weeks 2 days post depo. Voices no concerns. Depo given today per order by Hazle Coca, CNM dated 04/03/2021. Tolerate well R delt (per pt, arm preference). Next depo due 09/06/2021, has reminder. Jerel Shepherd, RN

## 2021-08-10 ENCOUNTER — Telehealth: Payer: Self-pay

## 2021-08-10 NOTE — Telephone Encounter (Signed)
Telephone call to patient today regarding her repeat PAP and physical due 07-2021.  Voicemail full no message left.  Hart Carwin, RN ? ?

## 2021-08-10 NOTE — Progress Notes (Signed)
Telephone call to patient today. VM full. Hart Carwin, RN ? ?

## 2021-08-23 NOTE — Telephone Encounter (Signed)
Telephone call to patient regarding her repeat PAP and PE due 07-2021.  No response to her PAP letter mailed.  Left message for her to please call the appointment line number and schedule her PAP and PE at [phone].  Dahlia Bailiff, RN ? ?

## 2021-08-25 ENCOUNTER — Encounter: Payer: Self-pay | Admitting: Nurse Practitioner

## 2021-08-25 ENCOUNTER — Ambulatory Visit (LOCAL_COMMUNITY_HEALTH_CENTER): Payer: Medicaid Other | Admitting: Nurse Practitioner

## 2021-08-25 VITALS — BP 131/85 | Ht 60.0 in | Wt 168.6 lb

## 2021-08-25 DIAGNOSIS — Z3009 Encounter for other general counseling and advice on contraception: Secondary | ICD-10-CM | POA: Diagnosis not present

## 2021-08-25 NOTE — Progress Notes (Signed)
[facility] problem [facility]  [facility] ? ?Subjective:  ?Toni Black is a 36 y.o. being seen today for a repeat PAP.  Previous history of abnormal PAP smears. Last PAP 07/07/2020 NIL, HPV negative.   ? ?Chief Complaint  ?Patient presents with  ? Gynecologic Exam  ?  Repeat Pap smear  ? ? ?Gynecologic Exam ?Pertinent negatives include no abdominal pain, back pain, chills, constipation, diarrhea, dysuria, fever, frequency, headaches, joint pain, nausea, rash, sore throat or vomiting.  ? ? ?Does the patient have a current or past history of drug use? No  ? No components found for: HCV] ? ? ?Health Maintenance Due  ?Topic Date Due  ? COVID-19 Vaccine (1) Never done  ? ? ?Review of Systems  ?Constitutional:  Negative for chills, fever, malaise/fatigue and weight loss.  ?HENT:  Negative for congestion, hearing loss and sore throat.   ?Eyes:  Negative for blurred vision, double vision and photophobia.  ?Respiratory:  Negative for shortness of breath.   ?Cardiovascular:  Negative for chest pain.  ?Gastrointestinal:  Negative for abdominal pain, blood in stool, constipation, diarrhea, heartburn, nausea and vomiting.  ?Genitourinary:  Negative for dysuria and frequency.  ?Musculoskeletal:  Negative for back pain, joint pain and neck pain.  ?Skin:  Negative for itching and rash.  ?Neurological:  Negative for dizziness, weakness and headaches.  ?Endo/Heme/Allergies:  Does not bruise/bleed easily.  ?Psychiatric/Behavioral:  Negative for depression, substance abuse and suicidal ideas.   ? ?The following portions of the patient's history were reviewed and updated as appropriate: allergies, current medications, past family history, past medical history, past social history, past surgical history and problem list. Problem list updated. ? ? ?See flowsheet for other program required questions. ? ?Objective:  ? ?Vitals:  ? 08/25/21 1331  ?BP: 131/85  ?Weight: 168 lb 9.6 oz (76.5 kg)  ?Height:  5' (1.524 m)  ? ? ?Physical Exam ?Constitutional:   ?   Appearance: Normal appearance.  ?HENT:  ?   Head: Normocephalic.  ?Cardiovascular:  ?   Rate and Rhythm: Normal rate and regular rhythm.  ?Pulmonary:  ?   Effort: Pulmonary effort is normal.  ?   Breath sounds: Normal breath sounds.  ?Genitourinary: ?   Comments: External genitalia/pubic area without nits, lice, edema, erythema, lesions and inguinal adenopathy. ?Vagina with normal mucosa and discharge. ?Cervix without visible lesions. ?Uterus firm, mobile, nt, no masses, no CMT, no adnexal tenderness or fullness.  ?Musculoskeletal:  ?   Cervical back: Full passive range of motion without pain and normal range of motion.  ?Skin: ?   General: Skin is warm and dry.  ?Neurological:  ?   Mental Status: She is alert and oriented to person, place, and time.  ?Psychiatric:     ?   Attention and Perception: Attention normal.     ?   Mood and Affect: Mood normal.     ?   Speech: Speech normal.     ?   Behavior: Behavior is cooperative.  ? ? ? ? ?Assessment and Plan:  ?Toni Black is a 36 y.o. female presenting to the [facility] Department for a Women's Health problem visit ? ?1. Family planning ?-36 year old female in clinic today for a repeat PAP. Patient has no other signs and symptoms.  Declines STD screenings. Next RP due 02/2022.  Patient continues to use Depo as her birth control method. Will await PAP results.  ?- IGP, Aptima HPV  ? ? ?  Return in about 6 months (around 03/03/2022) for Annual well-woman exam. ? ? ? ?Glenna Fellows, FNP ?

## 2021-08-25 NOTE — Telephone Encounter (Signed)
Return call from patient.  Repeat PAP and PE schedule for 08-25-2021 at 2 pm.  Dahlia Bailiff, RN ? ?

## 2021-08-31 LAB — IGP, APTIMA HPV
HPV Aptima: NEGATIVE
PAP Smear Comment: 0

## 2021-09-12 ENCOUNTER — Ambulatory Visit (LOCAL_COMMUNITY_HEALTH_CENTER): Payer: Medicaid Other

## 2021-09-12 VITALS — BP 137/71 | Ht 60.0 in | Wt 170.5 lb

## 2021-09-12 DIAGNOSIS — Z3009 Encounter for other general counseling and advice on contraception: Secondary | ICD-10-CM | POA: Diagnosis not present

## 2021-09-12 DIAGNOSIS — Z3042 Encounter for surveillance of injectable contraceptive: Secondary | ICD-10-CM | POA: Diagnosis not present

## 2021-09-12 NOTE — Progress Notes (Signed)
11 weeks 6 days post depo.  Denies any complications.  Depo administered IM right deltoid (pt's preference) per order dated 04/03/21 by Ola Spurr, CNM; tolerated well. ? ?Tonny Branch, RN  ? ? ?

## 2021-09-14 ENCOUNTER — Ambulatory Visit: Payer: Medicaid Other

## 2021-09-21 ENCOUNTER — Ambulatory Visit: Payer: Medicaid Other

## 2021-12-04 ENCOUNTER — Ambulatory Visit (LOCAL_COMMUNITY_HEALTH_CENTER): Payer: Medicaid Other

## 2021-12-04 VITALS — BP 122/78 | Ht 60.0 in | Wt 170.0 lb

## 2021-12-04 DIAGNOSIS — Z309 Encounter for contraceptive management, unspecified: Secondary | ICD-10-CM

## 2021-12-04 DIAGNOSIS — Z3042 Encounter for surveillance of injectable contraceptive: Secondary | ICD-10-CM | POA: Diagnosis not present

## 2021-12-04 DIAGNOSIS — Z3009 Encounter for other general counseling and advice on contraception: Secondary | ICD-10-CM

## 2021-12-04 NOTE — Progress Notes (Signed)
11 weeks 6 days post depo. Voices no concerns. Pt explains she is considering BTL and requests info. RN gave pt sterilization info sheet. Wants to continue depo today. Depo administered per order by Hazle Coca, CNM dated 04/03/2021. Tolerated well R delt (arm preference per pt). Next depo due 02/09/2022, has reminder. Jerel Shepherd, RN

## 2022-03-07 ENCOUNTER — Ambulatory Visit: Payer: Medicaid Other

## 2022-03-20 ENCOUNTER — Ambulatory Visit: Payer: Medicaid Other

## 2022-04-09 ENCOUNTER — Telehealth: Payer: Self-pay

## 2022-04-09 NOTE — Telephone Encounter (Signed)
Pt inquiring about last depo shot.   Last depo administered by [facility] on 12/04/21. Pt is 18.0 weeks today.  Pt due for physical also. PP visit on 03/02/21.  Called pt back at [phone], unable to leave message on voicemail, not set up. Tried twice. Called [phone], left details about depo and physical on pt's voicemail. Provided appt line number also.

## 2022-04-10 NOTE — Telephone Encounter (Addendum)
Called pt at [phone], unable to leave message on voicemail, not set up. Tried twice.  Called [phone], left details about depo and physical on pt's voicemail. Provided appt line number.

## 2022-04-11 NOTE — Telephone Encounter (Signed)
Called [phone], left details about depo and physical on pt's voicemail. Provided appt line number.

## 2022-05-07 NOTE — L&D Delivery Note (Signed)
Delivery Note  Toni Black is a [id number] at [redacted]w[redacted]d with an LMP of 06/21/22, inconsistent with [place] at [redacted]w[redacted]d.   First Stage: Labor onset: 1830 Augmentation: AROM Analgesia Eliezer Lofts intrapartum: Labor support without medications Date/time: 03/12/23 0225 GBS: Positive/-- (10/23 1634)  IP Antibiotics: abx: Penicillin x2 doses  Second Stage: Complete dilation at 0326 Onset of pushing at 0327 FHR second stage Baseline: 135 bpm, Variability: Good {> 6 bpm), Accelerations: Reactive, and Decelerations: Variable: moderate decels with pushing   Bhavika presented to L&D for latent labor. She was 4.5/70/-2. She progressed  to C/C/+2 with a spontaneous urge to push.  She pushed  effectively over approximately 1 minute for a spontaneous vaginal birth.  Delivery of a viable baby boy on11/09/2022 by CNM Delivery of fetal head in position: Occiput,, Anterior position with restitution to position: Right,, Occiput,, Transverse no nuchal cord; Anterior then posterior shoulders delivered easily with gentle downward traction. Baby placed on mom's chest, and attended to by baby RN Cord double clamped after cessation of pulsation, cut by CNM  Cord blood sample collection: Yes O POS Collection of cord blood donation no Arterial cord blood sample no  Third Stage: Oxytocin bolus started after delivery of infant for hemorrhage prophylaxis  Placenta delivered duncan intact with 3 VC @ 0333 Placenta disposition: To Pathology: No  Uterine tone firm / moderate  Laceration: 2nd degree and vaginal laceration identified  Anesthesia for repair: procedures; anesthesia: local Repair suture type: 2.0 vicryl Est. Blood Loss (mL): 300  Complications:Diagnoses; OB-GYN delivery complications: none  Mom to postpartum.  Baby to Couplet care / Skin to Skin.  Newborn: Information for the patient's newborn:  Toni, Black [[id number]]  Live born female  Birth Weight:   APGAR: 8, 9  Newborn Delivery   Birth  date/time: 03/12/2023 03:28:00 Delivery type: Vaginal, Spontaneous      Feeding planned: formula feeding  ---------- Chari Manning, CNM Certified Nurse Midwife [facility]

## 2022-05-11 ENCOUNTER — Ambulatory Visit (LOCAL_COMMUNITY_HEALTH_CENTER): Payer: Medicaid Other | Admitting: Advanced Practice Midwife

## 2022-05-11 ENCOUNTER — Encounter: Payer: Self-pay | Admitting: Advanced Practice Midwife

## 2022-05-11 VITALS — BP 133/77 | HR 98 | Temp 97.2°F | Ht 60.0 in | Wt 167.6 lb

## 2022-05-11 DIAGNOSIS — Z3202 Encounter for pregnancy test, result negative: Secondary | ICD-10-CM | POA: Diagnosis not present

## 2022-05-11 DIAGNOSIS — Z309 Encounter for contraceptive management, unspecified: Secondary | ICD-10-CM | POA: Diagnosis not present

## 2022-05-11 DIAGNOSIS — R87612 Low grade squamous intraepithelial lesion on cytologic smear of cervix (LGSIL): Secondary | ICD-10-CM

## 2022-05-11 DIAGNOSIS — E6609 Other obesity due to excess calories: Secondary | ICD-10-CM

## 2022-05-11 DIAGNOSIS — N879 Dysplasia of cervix uteri, unspecified: Secondary | ICD-10-CM

## 2022-05-11 DIAGNOSIS — Z3009 Encounter for other general counseling and advice on contraception: Secondary | ICD-10-CM

## 2022-05-11 DIAGNOSIS — E669 Obesity, unspecified: Secondary | ICD-10-CM | POA: Insufficient documentation

## 2022-05-11 DIAGNOSIS — Z30013 Encounter for initial prescription of injectable contraceptive: Secondary | ICD-10-CM

## 2022-05-11 DIAGNOSIS — E663 Overweight: Secondary | ICD-10-CM | POA: Insufficient documentation

## 2022-05-11 LAB — WET PREP FOR TRICH, YEAST, CLUE
Trichomonas Exam: NEGATIVE
Yeast Exam: NEGATIVE

## 2022-05-11 LAB — PREGNANCY, URINE: Preg Test, Ur: NEGATIVE
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
## 2022-05-11 NOTE — Progress Notes (Signed)
In House Lab WNL and reviewed at time of visit. Healthy Habits for Life, Engineer, site and condoms given to patient. BThiele RN

## 2022-08-06 ENCOUNTER — Ambulatory Visit (LOCAL_COMMUNITY_HEALTH_CENTER): Payer: Medicaid Other

## 2022-08-06 VITALS — BP 133/86 | Ht 65.0 in | Wt 164.5 lb

## 2022-08-06 DIAGNOSIS — Z3201 Encounter for pregnancy test, result positive: Secondary | ICD-10-CM

## 2022-08-06 DIAGNOSIS — Z309 Encounter for contraceptive management, unspecified: Secondary | ICD-10-CM

## 2022-08-06 LAB — PREGNANCY, URINE: Preg Test, Ur: POSITIVE — AB

## 2022-08-06 MED ORDER — PRENATAL 27-0.8 MG PO TABS
1.0000 | ORAL_TABLET | Freq: Every day | ORAL | 0 refills | Status: AC
Start: 2022-08-06 — End: 2022-11-14

## 2022-08-06 NOTE — Progress Notes (Signed)
UPT positive. Plans prenatal care at [facility]. To clerk for preadmit. 

## 2022-08-17 ENCOUNTER — Telehealth: Payer: Self-pay | Admitting: Family Medicine

## 2022-08-17 NOTE — Telephone Encounter (Signed)
Please give me a call back I need to either reshedule my appt or do half the same day and have another appt added

## 2022-08-17 NOTE — Telephone Encounter (Signed)
Call to client who needs to change 09/03/2022 new OB appt as scheduled to work that day. Per client, needs appt on Thursday. No available new OB appts on Thursdays in April and new OB appt scheduled for 09/06/2022 with arrival time of 0800. Client agreeable with appt and aware of arrival time. Jossie Ng, RN

## 2022-08-17 NOTE — Telephone Encounter (Signed)
Call to client's cell number and per recorded message

## 2022-08-22 ENCOUNTER — Telehealth: Payer: Self-pay | Admitting: Obstetrics and Gynecology

## 2022-08-22 NOTE — Telephone Encounter (Signed)
Pt called to see what medicine would be appropriate for her to take, since she has a cold. Please call her back, thanks.

## 2022-08-22 NOTE — Telephone Encounter (Signed)
Returned phone call and spoke with patient. Patient asking if she can take a cold medicine she has purchased from [employer]. RN counseled to only take medicine for cold listed on "Safe Medication list in Pregnancy". RN reviewed above medication list with patient as well as mailed a paper copy to current address. Reminded to keep 09/06/22 MH IP appt. Tawny Hopping, RN

## 2022-09-06 ENCOUNTER — Telehealth: Payer: Self-pay | Admitting: Family Medicine

## 2022-09-06 ENCOUNTER — Ambulatory Visit: Payer: Medicaid Other | Admitting: Physician Assistant

## 2022-09-06 VITALS — BP 121/78 | HR 96 | Temp 97.3°F | Wt 161.4 lb

## 2022-09-06 DIAGNOSIS — O0940 Supervision of pregnancy with grand multiparity, unspecified trimester: Secondary | ICD-10-CM | POA: Insufficient documentation

## 2022-09-06 DIAGNOSIS — O0941 Supervision of pregnancy with grand multiparity, first trimester: Secondary | ICD-10-CM | POA: Diagnosis not present

## 2022-09-06 DIAGNOSIS — O09521 Supervision of elderly multigravida, first trimester: Secondary | ICD-10-CM

## 2022-09-06 DIAGNOSIS — O09529 Supervision of elderly multigravida, unspecified trimester: Secondary | ICD-10-CM | POA: Insufficient documentation

## 2022-09-06 DIAGNOSIS — K029 Dental caries, unspecified: Secondary | ICD-10-CM

## 2022-09-06 LAB — HEMOGLOBIN, FINGERSTICK: Hemoglobin: 12.2 g/dL (ref 11.1–15.9)

## 2022-09-06 MED ORDER — ASPIRIN 81 MG PO TBEC: 81.0000 mg | DELAYED_RELEASE_TABLET | Freq: Every day | ORAL | Status: AC
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
## 2022-09-06 NOTE — Progress Notes (Signed)
Declines Flu and COVID vaccine. In [facility] reviewed during visit. BTHIELE RN

## 2022-09-06 NOTE — Telephone Encounter (Signed)
Patient states that she was suppose to receive a phone call before noon today about scheduling an ultrasound because the provider she saw today told her that her uterus is larger than it should be. She states that she never received a call.

## 2022-09-07 ENCOUNTER — Other Ambulatory Visit: Payer: Self-pay | Admitting: Advanced Practice Midwife

## 2022-09-07 ENCOUNTER — Telehealth: Payer: Self-pay | Admitting: Family Medicine

## 2022-09-07 ENCOUNTER — Telehealth: Payer: Self-pay

## 2022-09-07 DIAGNOSIS — O0940 Supervision of pregnancy with grand multiparity, unspecified trimester: Secondary | ICD-10-CM

## 2022-09-07 DIAGNOSIS — K029 Dental caries, unspecified: Secondary | ICD-10-CM

## 2022-09-07 DIAGNOSIS — O09529 Supervision of elderly multigravida, unspecified trimester: Secondary | ICD-10-CM

## 2022-09-07 NOTE — Telephone Encounter (Signed)
Refer to phone encounter previously opened this am. Jossie Ng, RN

## 2022-09-07 NOTE — Telephone Encounter (Signed)
Please refer to phone encounter opened 09/07/2022 regarding [place] appt. Jossie Ng, RN

## 2022-09-07 NOTE — Telephone Encounter (Signed)
Returned client's call and [place] appt provided. Verbal directions to [facility] provided and client verbalized aware where located. Jossie Ng, RN

## 2022-09-07 NOTE — Telephone Encounter (Signed)
Call to client to notify her of [facility] [place] appt scheduled on 09/17/2022 with arrival time of 1:45 pm. Left message to call with number provided. (Scheduler changed [place] order to be done at [facility]FM as provider intended and sent to E. Sciora CNM for signing as ordering provider A. Streilein PA-C will not be in clinic until 09/13/22).

## 2022-09-07 NOTE — Telephone Encounter (Signed)
Sorry I missed your call Please call me back .Marland KitchenMarland KitchenMarland KitchenMarland Kitchen

## 2022-09-10 LAB — MICROSCOPIC EXAMINATION
Casts: NONE SEEN /lpf
Epithelial Cells (non renal): >10 /hpf — AB (ref 0–10)
RBC, Urine: NONE SEEN /hpf (ref 0–2)
WBC, UA: NONE SEEN /hpf (ref 0–5)

## 2022-09-10 LAB — MATERNIT 21 PLUS CORE, BLOOD
Fetal Fraction: 5
Result (T21): NEGATIVE
Trisomy 13 (Patau syndrome): NEGATIVE
Trisomy 18 (Edwards syndrome): NEGATIVE
Trisomy 21 (Down syndrome): NEGATIVE

## 2022-09-10 LAB — PREGNANCY, INITIAL SCREEN
Antibody Screen: NEGATIVE
Basophils Absolute: 0.1 10*3/uL (ref 0.0–0.2)
Basos: 1 %
Bilirubin, UA: NEGATIVE
Chlamydia trachomatis, NAA: NEGATIVE
EOS (ABSOLUTE): 0.3 10*3/uL (ref 0.0–0.4)
Eos: 3 %
Glucose, UA: NEGATIVE
HCV Ab: NONREACTIVE
HIV Screen 4th Generation wRfx: NONREACTIVE
Hematocrit: 37.3 % (ref 34.0–46.6)
Hemoglobin: 11.9 g/dL (ref 11.1–15.9)
Hepatitis B Surface Ag: NEGATIVE
Immature Grans (Abs): 0.1 10*3/uL (ref 0.0–0.1)
Immature Granulocytes: 1 %
Ketones, UA: NEGATIVE
Leukocytes,UA: NEGATIVE
Lymphocytes Absolute: 1.3 10*3/uL (ref 0.7–3.1)
Lymphs: 14 %
MCH: 25.9 pg — ABNORMAL LOW (ref 26.6–33.0)
MCHC: 31.9 g/dL (ref 31.5–35.7)
MCV: 81 fL (ref 79–97)
Monocytes Absolute: 0.5 10*3/uL (ref 0.1–0.9)
Monocytes: 5 %
Neisseria Gonorrhoeae by PCR: NEGATIVE
Neutrophils Absolute: 7.6 10*3/uL — ABNORMAL HIGH (ref 1.4–7.0)
Neutrophils: 76 %
Nitrite, UA: NEGATIVE
Platelets: 322 10*3/uL (ref 150–450)
Protein,UA: NEGATIVE
RBC, UA: NEGATIVE
RBC: 4.6 x10E6/uL (ref 3.77–5.28)
RDW: 15.3 % (ref 11.7–15.4)
RPR Ser Ql: NONREACTIVE
Rh Factor: POSITIVE
Rubella Antibodies, IGG: 13.1 index (ref >0.99)
Specific Gravity, UA: 1.017 (ref 1.005–1.030)
Urobilinogen, Ur: 0.2 mg/dL (ref 0.2–1.0)
WBC: 9.8 10*3/uL (ref 3.4–10.8)
pH, UA: 7 (ref 5.0–7.5)

## 2022-09-10 LAB — URINE CULTURE, OB REFLEX

## 2022-09-10 LAB — HCV INTERPRETATION

## 2022-09-10 LAB — LEAD, BLOOD (ADULT >= 16 YRS): Lead-Whole Blood: <1 ug/dL (ref 0.0–3.4)

## 2022-09-17 ENCOUNTER — Other Ambulatory Visit: Payer: Self-pay

## 2022-09-17 ENCOUNTER — Ambulatory Visit: Payer: Medicaid Other | Attending: Maternal & Fetal Medicine

## 2022-09-17 ENCOUNTER — Encounter: Payer: Self-pay | Admitting: Advanced Practice Midwife

## 2022-09-17 ENCOUNTER — Other Ambulatory Visit: Payer: Self-pay | Admitting: Advanced Practice Midwife

## 2022-09-17 DIAGNOSIS — Z363 Encounter for antenatal screening for malformations: Secondary | ICD-10-CM | POA: Diagnosis not present

## 2022-09-17 DIAGNOSIS — Z3A14 14 weeks gestation of pregnancy: Secondary | ICD-10-CM | POA: Insufficient documentation

## 2022-09-17 DIAGNOSIS — O0940 Supervision of pregnancy with grand multiparity, unspecified trimester: Secondary | ICD-10-CM

## 2022-09-17 DIAGNOSIS — K029 Dental caries, unspecified: Secondary | ICD-10-CM

## 2022-09-17 DIAGNOSIS — O26842 Uterine size-date discrepancy, second trimester: Secondary | ICD-10-CM | POA: Diagnosis not present

## 2022-09-17 DIAGNOSIS — O09522 Supervision of elderly multigravida, second trimester: Secondary | ICD-10-CM | POA: Insufficient documentation

## 2022-09-17 DIAGNOSIS — Z3687 Encounter for antenatal screening for uncertain dates: Secondary | ICD-10-CM | POA: Insufficient documentation

## 2022-09-17 DIAGNOSIS — O0942 Supervision of pregnancy with grand multiparity, second trimester: Secondary | ICD-10-CM | POA: Diagnosis present

## 2022-09-17 DIAGNOSIS — O09529 Supervision of elderly multigravida, unspecified trimester: Secondary | ICD-10-CM

## 2022-10-03 ENCOUNTER — Telehealth: Payer: Self-pay | Admitting: Family Medicine

## 2022-10-03 NOTE — Telephone Encounter (Signed)
RECIEVED CALL FROM [facility] AND MISSED THE CALL WOULD LIKE A CALLBACK

## 2022-10-04 ENCOUNTER — Encounter: Payer: Self-pay | Admitting: Physician Assistant

## 2022-10-04 ENCOUNTER — Ambulatory Visit: Payer: Medicaid Other | Admitting: Physician Assistant

## 2022-10-04 VITALS — BP 106/67 | HR 101 | Temp 98.1°F | Wt 160.8 lb

## 2022-10-04 DIAGNOSIS — O09522 Supervision of elderly multigravida, second trimester: Secondary | ICD-10-CM

## 2022-10-04 DIAGNOSIS — Z3A17 17 weeks gestation of pregnancy: Secondary | ICD-10-CM

## 2022-10-04 DIAGNOSIS — O0942 Supervision of pregnancy with grand multiparity, second trimester: Secondary | ICD-10-CM

## 2022-10-04 DIAGNOSIS — O09529 Supervision of elderly multigravida, unspecified trimester: Secondary | ICD-10-CM

## 2022-10-04 DIAGNOSIS — O0940 Supervision of pregnancy with grand multiparity, unspecified trimester: Secondary | ICD-10-CM

## 2022-10-04 NOTE — Progress Notes (Signed)
Patient here for MH RV at 17 2/7. Has anatomy U/S on 10/22/22, patient aware. Declines AFP only today.Burt Knack, RN
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
## 2022-10-10 NOTE — Addendum Note (Signed)
Addended by: Heywood Bene on: 10/10/2022 03:08 PM   Modules accepted: Orders

## 2022-10-17 ENCOUNTER — Other Ambulatory Visit: Payer: Self-pay

## 2022-10-17 DIAGNOSIS — O09522 Supervision of elderly multigravida, second trimester: Secondary | ICD-10-CM

## 2022-10-17 DIAGNOSIS — Z641 Problems related to multiparity: Secondary | ICD-10-CM

## 2022-10-22 ENCOUNTER — Ambulatory Visit: Payer: Medicaid Other | Attending: Maternal & Fetal Medicine

## 2022-10-22 ENCOUNTER — Other Ambulatory Visit: Payer: Self-pay

## 2022-10-22 DIAGNOSIS — O4442 Low lying placenta NOS or without hemorrhage, second trimester: Secondary | ICD-10-CM

## 2022-10-22 DIAGNOSIS — Z369 Encounter for antenatal screening, unspecified: Secondary | ICD-10-CM | POA: Insufficient documentation

## 2022-10-22 DIAGNOSIS — Z3A19 19 weeks gestation of pregnancy: Secondary | ICD-10-CM

## 2022-10-22 DIAGNOSIS — Z641 Problems related to multiparity: Secondary | ICD-10-CM | POA: Diagnosis not present

## 2022-10-22 DIAGNOSIS — O43192 Other malformation of placenta, second trimester: Secondary | ICD-10-CM | POA: Diagnosis not present

## 2022-10-22 DIAGNOSIS — O09522 Supervision of elderly multigravida, second trimester: Secondary | ICD-10-CM | POA: Diagnosis not present

## 2022-11-01 ENCOUNTER — Ambulatory Visit: Payer: Medicaid Other

## 2022-11-02 ENCOUNTER — Ambulatory Visit: Payer: Medicaid Other | Admitting: Family Medicine

## 2022-11-02 VITALS — BP 111/72 | HR 98 | Temp 98.0°F | Wt 160.0 lb

## 2022-11-02 DIAGNOSIS — O0942 Supervision of pregnancy with grand multiparity, second trimester: Secondary | ICD-10-CM

## 2022-11-02 DIAGNOSIS — O09522 Supervision of elderly multigravida, second trimester: Secondary | ICD-10-CM

## 2022-11-02 DIAGNOSIS — O09529 Supervision of elderly multigravida, unspecified trimester: Secondary | ICD-10-CM

## 2022-11-02 DIAGNOSIS — E663 Overweight: Secondary | ICD-10-CM

## 2022-11-02 DIAGNOSIS — Z3A21 21 weeks gestation of pregnancy: Secondary | ICD-10-CM

## 2022-11-02 DIAGNOSIS — O4440 Low lying placenta NOS or without hemorrhage, unspecified trimester: Secondary | ICD-10-CM | POA: Insufficient documentation

## 2022-11-02 DIAGNOSIS — O0940 Supervision of pregnancy with grand multiparity, unspecified trimester: Secondary | ICD-10-CM

## 2022-11-02 DIAGNOSIS — O43199 Other malformation of placenta, unspecified trimester: Secondary | ICD-10-CM | POA: Insufficient documentation

## 2022-11-02 NOTE — Progress Notes (Signed)
Kept [facility] [place] appt 10/22/22 and aware of next scheduled [place] at same facility on 11/26/22. Per client, was told at [place] appt appeared to be a female baby. AFP declined by client again today. Jossie Ng, RN
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
## 2022-11-09 ENCOUNTER — Telehealth: Payer: Self-pay | Admitting: Advanced Practice Midwife

## 2022-11-09 NOTE — Telephone Encounter (Signed)
Pt has a lot of pain, sharp pain, and wants a call

## 2022-11-09 NOTE — Telephone Encounter (Signed)
Patient states yesterday afternoon, 11/08/2022, sharp abdominal pain, rated as  9/10 on a scale of 01- 10  lasting about 15 minutes. Denies vaginal bleeding, leaking of fluid and admits to decrease fetal movement. Patient advised to go to the emergency room. BTHIELE RN

## 2022-11-09 NOTE — Telephone Encounter (Signed)
Call to client and per recorded message, voicemail is not set up. Pink sticky noted to request she set up voicemail. Return call to client to ascertain if would answer phone, but same recorded message heard. Jossie Ng, RN

## 2022-11-16 ENCOUNTER — Encounter: Payer: Self-pay | Admitting: Obstetrics and Gynecology

## 2022-11-16 ENCOUNTER — Telehealth: Payer: Self-pay | Admitting: Family Medicine

## 2022-11-16 ENCOUNTER — Observation Stay
Admission: EM | Admit: 2022-11-16 | Discharge: 2022-11-16 | Disposition: A | Discharge status: Home / Self Care | Payer: Medicaid Other | Attending: Obstetrics and Gynecology | Admitting: Obstetrics and Gynecology

## 2022-11-16 ENCOUNTER — Other Ambulatory Visit: Payer: Self-pay

## 2022-11-16 DIAGNOSIS — O132 Gestational [pregnancy-induced] hypertension without significant proteinuria, second trimester: Secondary | ICD-10-CM | POA: Diagnosis not present

## 2022-11-16 DIAGNOSIS — O26892 Other specified pregnancy related conditions, second trimester: Secondary | ICD-10-CM | POA: Insufficient documentation

## 2022-11-16 DIAGNOSIS — O4440 Low lying placenta NOS or without hemorrhage, unspecified trimester: Secondary | ICD-10-CM

## 2022-11-16 DIAGNOSIS — O4700 False labor before 37 completed weeks of gestation, unspecified trimester: Secondary | ICD-10-CM | POA: Diagnosis present

## 2022-11-16 DIAGNOSIS — Z3A23 23 weeks gestation of pregnancy: Secondary | ICD-10-CM | POA: Insufficient documentation

## 2022-11-16 DIAGNOSIS — O43199 Other malformation of placenta, unspecified trimester: Principal | ICD-10-CM

## 2022-11-16 LAB — URINALYSIS, ROUTINE W REFLEX MICROSCOPIC
Bilirubin Urine: NEGATIVE
Glucose, UA: NEGATIVE mg/dL
Hgb urine dipstick: NEGATIVE
Ketones, ur: NEGATIVE mg/dL
Leukocytes,Ua: NEGATIVE
Nitrite: NEGATIVE
Protein, ur: NEGATIVE mg/dL
Specific Gravity, Urine: 1.003 — ABNORMAL LOW (ref 1.005–1.030)
pH: 7 (ref 5.0–8.0)

## 2022-11-16 LAB — WET PREP, GENITAL
Clue Cells Wet Prep HPF POC: NONE SEEN
Sperm: NONE SEEN
Trich, Wet Prep: NONE SEEN
WBC, Wet Prep HPF POC: <10 (ref <10)
Yeast Wet Prep HPF POC: NONE SEEN

## 2022-11-16 MED ORDER — PRENATAL MULTIVITAMIN CH: 1.0000 | ORAL_TABLET | Freq: Every day | ORAL | Status: DC

## 2022-11-16 MED ORDER — CALCIUM CARBONATE ANTACID 500 MG PO CHEW: 2.0000 | CHEWABLE_TABLET | ORAL | Status: DC | PRN

## 2022-11-16 MED ORDER — ACETAMINOPHEN 325 MG PO TABS: 650.0000 mg | ORAL_TABLET | ORAL | Status: DC | PRN

## 2022-11-16 MED ORDER — LACTATED RINGERS IV SOLN: 125.0000 mL/h | INTRAVENOUS | Status: DC

## 2022-11-16 MED ORDER — DOCUSATE SODIUM 100 MG PO CAPS: 100.0000 mg | ORAL_CAPSULE | Freq: Every day | ORAL | Status: DC

## 2022-11-16 MED ORDER — ZOLPIDEM TARTRATE 5 MG PO TABS: 5.0000 mg | ORAL_TABLET | Freq: Every evening | ORAL | Status: DC | PRN

## 2022-11-16 NOTE — Telephone Encounter (Signed)
Call to client at home number and no answer. Per recorded message, no voicemail set up. Call to client's cell phone and left message for her to call maternity clinic at [facility] Monday (11/19/22). Number to call provided.  Per provider note today at [facility] and After Visit Summary, client to keep follow-up as scheduled. Jossie Ng, RN

## 2022-11-16 NOTE — OB Triage Note (Signed)

## 2022-11-16 NOTE — OB Triage Note (Signed)
Pt Toni Black 37 y.o. presents to labor and delivery triage reporting that she was feeling bad at work. She was experiencing pain in her lower abdomen. She started feeling dizzy and her heart was racing. Pt is a G7P5106 at [redacted]w[redacted]d . Pt denies signs and symptons consistent with rupture of membranes or active vaginal bleeding. Pt denies contractions and states that she hasn't felt baby move much during this pregnancy yet. External FM and TOCO applied to non-tender abdomen and assessing. Initial FHR 154. Vital signs obtained and within normal limits. Provider notified of pt.
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
## 2022-11-16 NOTE — Telephone Encounter (Signed)
  Please give me a call back I went to the Er and they want me to do a follow up appt earlier then my scheduled appt on the 26 of july

## 2022-11-16 NOTE — Progress Notes (Signed)
   11/16/22 1231  Fetal Heart Rate A  Mode External  Baseline Rate (A) 150 bpm  Variability 6-25 BPM  Accelerations 10 x 10  Decelerations None  Uterine Activity  Mode Toco  Contraction Frequency (min) none   Appropriate NST for [redacted]w[redacted]d. Provider reviewed.

## 2022-11-17 LAB — URINE CULTURE

## 2022-11-18 LAB — URINE CULTURE

## 2022-11-19 ENCOUNTER — Telehealth: Payer: Self-pay | Admitting: Family Medicine

## 2022-11-19 NOTE — Telephone Encounter (Signed)
Patient returning Toni Black's phone call from Friday. She asked to be called on her cell which is the number attached. Have a great Monday!

## 2022-11-19 NOTE — Telephone Encounter (Signed)
Patient instructed to keep follow up visit as scheduled. BTHIELE RN

## 2022-11-21 ENCOUNTER — Other Ambulatory Visit: Payer: Self-pay

## 2022-11-21 DIAGNOSIS — O43199 Other malformation of placenta, unspecified trimester: Secondary | ICD-10-CM

## 2022-11-21 DIAGNOSIS — Z641 Problems related to multiparity: Secondary | ICD-10-CM

## 2022-11-21 DIAGNOSIS — O09529 Supervision of elderly multigravida, unspecified trimester: Secondary | ICD-10-CM

## 2022-11-21 DIAGNOSIS — O4440 Low lying placenta NOS or without hemorrhage, unspecified trimester: Secondary | ICD-10-CM

## 2022-11-21 DIAGNOSIS — O09522 Supervision of elderly multigravida, second trimester: Secondary | ICD-10-CM

## 2022-11-26 ENCOUNTER — Other Ambulatory Visit: Payer: Self-pay

## 2022-11-26 ENCOUNTER — Ambulatory Visit: Payer: Medicaid Other | Attending: Maternal & Fetal Medicine

## 2022-11-26 DIAGNOSIS — O43192 Other malformation of placenta, second trimester: Secondary | ICD-10-CM | POA: Insufficient documentation

## 2022-11-26 DIAGNOSIS — O43199 Other malformation of placenta, unspecified trimester: Secondary | ICD-10-CM

## 2022-11-26 DIAGNOSIS — Z3A24 24 weeks gestation of pregnancy: Secondary | ICD-10-CM | POA: Diagnosis not present

## 2022-11-26 DIAGNOSIS — O0942 Supervision of pregnancy with grand multiparity, second trimester: Secondary | ICD-10-CM | POA: Diagnosis not present

## 2022-11-26 DIAGNOSIS — O09529 Supervision of elderly multigravida, unspecified trimester: Secondary | ICD-10-CM

## 2022-11-26 DIAGNOSIS — Z641 Problems related to multiparity: Secondary | ICD-10-CM

## 2022-11-26 DIAGNOSIS — O4442 Low lying placenta NOS or without hemorrhage, second trimester: Secondary | ICD-10-CM | POA: Diagnosis not present

## 2022-11-26 DIAGNOSIS — O09522 Supervision of elderly multigravida, second trimester: Secondary | ICD-10-CM | POA: Insufficient documentation

## 2022-11-26 DIAGNOSIS — O4440 Low lying placenta NOS or without hemorrhage, unspecified trimester: Secondary | ICD-10-CM

## 2022-11-30 ENCOUNTER — Ambulatory Visit: Payer: Medicaid Other | Admitting: Advanced Practice Midwife

## 2022-11-30 VITALS — BP 114/68 | HR 83 | Temp 95.9°F | Wt 160.0 lb

## 2022-11-30 DIAGNOSIS — O43199 Other malformation of placenta, unspecified trimester: Secondary | ICD-10-CM

## 2022-11-30 DIAGNOSIS — O4440 Low lying placenta NOS or without hemorrhage, unspecified trimester: Secondary | ICD-10-CM

## 2022-11-30 DIAGNOSIS — O09529 Supervision of elderly multigravida, unspecified trimester: Secondary | ICD-10-CM

## 2022-11-30 DIAGNOSIS — O0942 Supervision of pregnancy with grand multiparity, second trimester: Secondary | ICD-10-CM

## 2022-11-30 DIAGNOSIS — O09522 Supervision of elderly multigravida, second trimester: Secondary | ICD-10-CM

## 2022-11-30 DIAGNOSIS — O0940 Supervision of pregnancy with grand multiparity, unspecified trimester: Secondary | ICD-10-CM
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
## 2022-11-30 NOTE — Progress Notes (Signed)
[facility] forms completed. Doe snot wish to sign BTL consent today "I am still deciding". BTHIELE RN

## 2022-12-03 ENCOUNTER — Observation Stay
Admission: EM | Admit: 2022-12-03 | Discharge: 2022-12-03 | Disposition: A | Discharge status: Home / Self Care | Payer: Medicaid Other | Attending: Obstetrics and Gynecology | Admitting: Obstetrics and Gynecology

## 2022-12-03 ENCOUNTER — Other Ambulatory Visit: Payer: Self-pay

## 2022-12-03 ENCOUNTER — Encounter: Payer: Self-pay | Admitting: Obstetrics and Gynecology

## 2022-12-03 ENCOUNTER — Emergency Department
Admission: EM | Admit: 2022-12-03 | Discharge: 2022-12-03 | Disposition: A | Discharge status: Home / Self Care | Payer: Medicaid Other

## 2022-12-03 DIAGNOSIS — E669 Obesity, unspecified: Secondary | ICD-10-CM | POA: Diagnosis not present

## 2022-12-03 DIAGNOSIS — Z3A25 25 weeks gestation of pregnancy: Secondary | ICD-10-CM | POA: Diagnosis not present

## 2022-12-03 DIAGNOSIS — O368120 Decreased fetal movements, second trimester, not applicable or unspecified: Principal | ICD-10-CM | POA: Insufficient documentation

## 2022-12-03 DIAGNOSIS — O4440 Low lying placenta NOS or without hemorrhage, unspecified trimester: Secondary | ICD-10-CM

## 2022-12-03 DIAGNOSIS — O99210 Obesity complicating pregnancy, unspecified trimester: Secondary | ICD-10-CM | POA: Insufficient documentation

## 2022-12-03 DIAGNOSIS — O43199 Other malformation of placenta, unspecified trimester: Principal | ICD-10-CM

## 2022-12-03 DIAGNOSIS — O368190 Decreased fetal movements, unspecified trimester, not applicable or unspecified: Secondary | ICD-10-CM | POA: Diagnosis present

## 2022-12-03 DIAGNOSIS — Z3490 Encounter for supervision of normal pregnancy, unspecified, unspecified trimester: Secondary | ICD-10-CM

## 2022-12-03 LAB — WET PREP, GENITAL
Clue Cells Wet Prep HPF POC: NONE SEEN
Sperm: NONE SEEN
Trich, Wet Prep: NONE SEEN
WBC, Wet Prep HPF POC: <10 (ref <10)
Yeast Wet Prep HPF POC: NONE SEEN

## 2022-12-03 NOTE — OB Triage Note (Signed)
Pt being discharged home by [place], CNM. Discharge instructions reviewed w patient. She was advised to return for decreased fetal movement, vaginal bleeding, ctx's, or leaking of fluids. Pt verbalized understanding. Stable and ambulatory.

## 2022-12-03 NOTE — Telephone Encounter (Signed)
Call to client who has been evaluated and discharged from [facility] L & D with normal wet prep since call this am. Per client, everything checked out okay. Jossie Ng, RN

## 2022-12-03 NOTE — ED Notes (Signed)
Patient called x3 for triage with no answer from the lobby.

## 2022-12-03 NOTE — Telephone Encounter (Signed)
Please give me a call back I need to speak with a nurse. Please use the [phone] number the other number is off
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
## 2022-12-03 NOTE — OB Triage Note (Signed)
37 y.o G7P5106 presents to L&D triage at [redacted]w[redacted]d c/o decreased fetal movement and vaginal discharge. Pt denies ctx's, vaginal bleeding, and lof. She reports last feeling her baby last night but says that this baby moves less than her other past babies in general. She notes thick white discharge last night. [place] CNM aware of pt arrival, monitors applied and assessing. Wet prep has been collected, awaiting result.

## 2022-12-21 ENCOUNTER — Ambulatory Visit: Payer: Medicaid Other | Admitting: Advanced Practice Midwife

## 2022-12-21 VITALS — BP 123/69 | HR 89 | Temp 97.2°F | Wt 161.4 lb

## 2022-12-21 DIAGNOSIS — K029 Dental caries, unspecified: Secondary | ICD-10-CM

## 2022-12-21 DIAGNOSIS — O0940 Supervision of pregnancy with grand multiparity, unspecified trimester: Secondary | ICD-10-CM

## 2022-12-21 DIAGNOSIS — O09529 Supervision of elderly multigravida, unspecified trimester: Secondary | ICD-10-CM

## 2022-12-21 DIAGNOSIS — O99013 Anemia complicating pregnancy, third trimester: Secondary | ICD-10-CM | POA: Diagnosis not present

## 2022-12-21 DIAGNOSIS — O0943 Supervision of pregnancy with grand multiparity, third trimester: Secondary | ICD-10-CM

## 2022-12-21 DIAGNOSIS — O09523 Supervision of elderly multigravida, third trimester: Secondary | ICD-10-CM

## 2022-12-21 DIAGNOSIS — O43199 Other malformation of placenta, unspecified trimester: Secondary | ICD-10-CM

## 2022-12-21 LAB — URINALYSIS
Bilirubin, UA: NEGATIVE
Glucose, UA: NEGATIVE
Ketones, UA: NEGATIVE
Nitrite, UA: NEGATIVE
Protein,UA: NEGATIVE
RBC, UA: NEGATIVE
Specific Gravity, UA: 1.01 (ref 1.005–1.030)
Urobilinogen, Ur: 0.2 mg/dL (ref 0.2–1.0)
pH, UA: 6.5 (ref 5.0–7.5)

## 2022-12-21 LAB — WET PREP FOR TRICH, YEAST, CLUE
Trichomonas Exam: NEGATIVE
Yeast Exam: NEGATIVE

## 2022-12-21 LAB — HEMOGLOBIN, FINGERSTICK: Hemoglobin: 10.1 g/dL — ABNORMAL LOW (ref 11.1–15.9)

## 2022-12-21 MED ORDER — IRON (FERROUS SULFATE) 325 (65 FE) MG PO TABS
325.0000 mg | ORAL_TABLET | Freq: Two times a day (BID) | ORAL | Status: DC
Start: 2022-12-21 — End: 2023-02-20

## 2022-12-21 NOTE — Addendum Note (Signed)
Addended by: Delynn Flavin A on: 12/21/2022 04:51 PM   Modules accepted: Orders

## 2022-12-21 NOTE — Progress Notes (Addendum)
Patient states partner may have vasectomy, declines BTL. In house lab results reviewed during visit. "Anemia in Pregnancy" pamphlet given to patient and anemia panel added to lab work. The patient was dispensed Iron tablets today. I provided counseling today regarding the medication. We discussed the medication, the side effects and when to call clinic. Patient given the opportunity to ask questions. Questions answered. BTHIELE RN
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
## 2022-12-22 LAB — FE+CBC/D/PLT+TIBC+FER+RETIC
Basophils Absolute: 0.1 10*3/uL (ref 0.0–0.2)
Basos: 1 %
EOS (ABSOLUTE): 0.6 10*3/uL — ABNORMAL HIGH (ref 0.0–0.4)
Eos: 5 %
Ferritin: 10 ng/mL — ABNORMAL LOW (ref 15–150)
Hematocrit: 30.7 % — ABNORMAL LOW (ref 34.0–46.6)
Hemoglobin: 10 g/dL — ABNORMAL LOW (ref 11.1–15.9)
Immature Grans (Abs): 0.1 10*3/uL (ref 0.0–0.1)
Immature Granulocytes: 1 %
Iron Saturation: 7 % — CL (ref 15–55)
Iron: 32 ug/dL (ref 27–159)
Lymphocytes Absolute: 1.3 10*3/uL (ref 0.7–3.1)
Lymphs: 12 %
MCH: 26 pg — ABNORMAL LOW (ref 26.6–33.0)
MCHC: 32.6 g/dL (ref 31.5–35.7)
MCV: 80 fL (ref 79–97)
Monocytes Absolute: 0.9 10*3/uL (ref 0.1–0.9)
Monocytes: 8 %
Neutrophils Absolute: 8 10*3/uL — ABNORMAL HIGH (ref 1.4–7.0)
Neutrophils: 73 %
Platelets: 283 10*3/uL (ref 150–450)
RBC: 3.85 x10E6/uL (ref 3.77–5.28)
RDW: 13.5 % (ref 11.7–15.4)
Retic Ct Pct: 1.5 % (ref 0.6–2.6)
Total Iron Binding Capacity: 453 ug/dL — ABNORMAL HIGH (ref 250–450)
UIBC: 421 ug/dL (ref 131–425)
WBC: 10.8 10*3/uL (ref 3.4–10.8)

## 2022-12-23 LAB — URINE CULTURE

## 2022-12-23 LAB — GLUCOSE, 1 HOUR GESTATIONAL: Gestational Diabetes Screen: 85 mg/dL (ref 70–139)

## 2022-12-23 LAB — RPR: RPR Ser Ql: NONREACTIVE

## 2022-12-23 LAB — HIV-1/HIV-2 QUALITATIVE RNA
HIV-1 RNA, Qualitative: NONREACTIVE
HIV-2 RNA, Qualitative: NONREACTIVE

## 2023-01-01 ENCOUNTER — Encounter: Payer: Self-pay | Admitting: Advanced Practice Midwife

## 2023-01-01 DIAGNOSIS — O99019 Anemia complicating pregnancy, unspecified trimester: Secondary | ICD-10-CM | POA: Insufficient documentation

## 2023-01-02 ENCOUNTER — Ambulatory Visit: Payer: Medicaid Other | Admitting: Family Medicine

## 2023-01-02 VITALS — BP 113/71 | HR 93 | Temp 98.2°F | Wt 161.4 lb

## 2023-01-02 DIAGNOSIS — O4440 Low lying placenta NOS or without hemorrhage, unspecified trimester: Secondary | ICD-10-CM

## 2023-01-02 DIAGNOSIS — O43199 Other malformation of placenta, unspecified trimester: Secondary | ICD-10-CM

## 2023-01-02 DIAGNOSIS — O99013 Anemia complicating pregnancy, third trimester: Secondary | ICD-10-CM

## 2023-01-02 DIAGNOSIS — O0940 Supervision of pregnancy with grand multiparity, unspecified trimester: Secondary | ICD-10-CM

## 2023-01-02 DIAGNOSIS — O0943 Supervision of pregnancy with grand multiparity, third trimester: Secondary | ICD-10-CM

## 2023-01-02 DIAGNOSIS — Z3A30 30 weeks gestation of pregnancy: Secondary | ICD-10-CM
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
## 2023-01-02 NOTE — Progress Notes (Signed)
Here today for 30.1 week MH RV. Taking PNV and Iron every day. Denies ED/hospital visits since last RV. Aware of and plans to keep MFM [place] appt for 01/09/23 @ 4:00 (3:45.) Tawny Hopping, RN

## 2023-01-03 NOTE — Addendum Note (Signed)
Addended by: Heywood Bene on: 01/03/2023 04:00 PM   Modules accepted: Orders

## 2023-01-04 ENCOUNTER — Other Ambulatory Visit: Payer: Self-pay

## 2023-01-04 DIAGNOSIS — O4440 Low lying placenta NOS or without hemorrhage, unspecified trimester: Secondary | ICD-10-CM

## 2023-01-04 DIAGNOSIS — O09529 Supervision of elderly multigravida, unspecified trimester: Secondary | ICD-10-CM

## 2023-01-04 DIAGNOSIS — O43199 Other malformation of placenta, unspecified trimester: Secondary | ICD-10-CM

## 2023-01-09 ENCOUNTER — Other Ambulatory Visit: Payer: Self-pay

## 2023-01-09 ENCOUNTER — Ambulatory Visit: Payer: Medicaid Other | Attending: Obstetrics

## 2023-01-09 DIAGNOSIS — O4443 Low lying placenta NOS or without hemorrhage, third trimester: Secondary | ICD-10-CM | POA: Diagnosis not present

## 2023-01-09 DIAGNOSIS — O09529 Supervision of elderly multigravida, unspecified trimester: Secondary | ICD-10-CM

## 2023-01-09 DIAGNOSIS — Z3A31 31 weeks gestation of pregnancy: Secondary | ICD-10-CM | POA: Diagnosis not present

## 2023-01-09 DIAGNOSIS — O0943 Supervision of pregnancy with grand multiparity, third trimester: Secondary | ICD-10-CM | POA: Diagnosis not present

## 2023-01-09 DIAGNOSIS — O4440 Low lying placenta NOS or without hemorrhage, unspecified trimester: Secondary | ICD-10-CM

## 2023-01-09 DIAGNOSIS — O43193 Other malformation of placenta, third trimester: Secondary | ICD-10-CM | POA: Insufficient documentation

## 2023-01-09 DIAGNOSIS — O43199 Other malformation of placenta, unspecified trimester: Secondary | ICD-10-CM

## 2023-01-09 DIAGNOSIS — O09523 Supervision of elderly multigravida, third trimester: Secondary | ICD-10-CM | POA: Diagnosis not present
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
## 2023-01-16 ENCOUNTER — Ambulatory Visit: Payer: Medicaid Other | Admitting: Family Medicine

## 2023-01-16 VITALS — BP 129/72 | HR 94 | Temp 98.0°F | Wt 162.0 lb

## 2023-01-16 DIAGNOSIS — O4440 Low lying placenta NOS or without hemorrhage, unspecified trimester: Secondary | ICD-10-CM

## 2023-01-16 DIAGNOSIS — O43199 Other malformation of placenta, unspecified trimester: Secondary | ICD-10-CM

## 2023-01-16 DIAGNOSIS — O99013 Anemia complicating pregnancy, third trimester: Secondary | ICD-10-CM

## 2023-01-16 DIAGNOSIS — O09529 Supervision of elderly multigravida, unspecified trimester: Secondary | ICD-10-CM

## 2023-01-16 DIAGNOSIS — O0943 Supervision of pregnancy with grand multiparity, third trimester: Secondary | ICD-10-CM

## 2023-01-16 DIAGNOSIS — O0940 Supervision of pregnancy with grand multiparity, unspecified trimester: Secondary | ICD-10-CM

## 2023-01-16 DIAGNOSIS — O09523 Supervision of elderly multigravida, third trimester: Secondary | ICD-10-CM

## 2023-01-16 NOTE — Progress Notes (Signed)
Here today for 32.1 week MH RV. Taking PNV, ASA and Iron every day. Denies ED/hospital visits since last RV. Has next scheduled [place] appt for 02/06/23. Declines Flu vaccine today. Has changed mind regarding BTL. Undecided regarding PP BCM. Tawny Hopping, RN

## 2023-01-30 ENCOUNTER — Other Ambulatory Visit: Payer: Self-pay | Admitting: Obstetrics

## 2023-01-30 DIAGNOSIS — O43193 Other malformation of placenta, third trimester: Secondary | ICD-10-CM

## 2023-01-31 ENCOUNTER — Telehealth: Payer: Self-pay

## 2023-01-31 ENCOUNTER — Ambulatory Visit: Payer: Medicaid Other

## 2023-01-31 NOTE — Telephone Encounter (Signed)
TC to patient to reschedule cancelled MH RV. Patient now scheduled for Monday, 02/04/23 at 3:30. Patient is aware she is overbooked and may have to wait. Burt Knack, RN

## 2023-02-04 ENCOUNTER — Ambulatory Visit: Payer: Medicaid Other | Admitting: Advanced Practice Midwife

## 2023-02-04 VITALS — Wt 160.2 lb

## 2023-02-04 DIAGNOSIS — F4323 Adjustment disorder with mixed anxiety and depressed mood: Secondary | ICD-10-CM

## 2023-02-04 DIAGNOSIS — O43199 Other malformation of placenta, unspecified trimester: Secondary | ICD-10-CM

## 2023-02-04 DIAGNOSIS — O99013 Anemia complicating pregnancy, third trimester: Secondary | ICD-10-CM

## 2023-02-04 DIAGNOSIS — O0940 Supervision of pregnancy with grand multiparity, unspecified trimester: Secondary | ICD-10-CM

## 2023-02-04 DIAGNOSIS — O09529 Supervision of elderly multigravida, unspecified trimester: Secondary | ICD-10-CM

## 2023-02-04 DIAGNOSIS — O0943 Supervision of pregnancy with grand multiparity, third trimester: Secondary | ICD-10-CM

## 2023-02-04 DIAGNOSIS — O09523 Supervision of elderly multigravida, third trimester: Secondary | ICD-10-CM

## 2023-02-04 LAB — HEMOGLOBIN, FINGERSTICK: Hemoglobin: 10 g/dL — ABNORMAL LOW (ref 11.1–15.9)
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
## 2023-02-04 NOTE — Progress Notes (Addendum)
Correctly verbalizes how to take prenatal vitamin and daily iron tablet. Taking iron tablet with juice and a full meal. Reports iron tablet beginning to cause her N/V and counseled to notify provider today. Hgb today. Aware of [place] [facility] [place] appt 02/06/23. Flu vaccine and RSV vaccine declined today. Jossie Ng, RN Hgb = 10.0 (10.2 12/21/22) and reviewed by E. Sciora CNM. Per Ms. Sciora, increase iron to BID and take on eat lunch and 1 with dinner. Client verbalized understanding and aware to call MHC if unable to tolerated. Jossie Ng, RN

## 2023-02-06 ENCOUNTER — Other Ambulatory Visit: Payer: Self-pay

## 2023-02-06 ENCOUNTER — Ambulatory Visit: Payer: Medicaid Other | Attending: Maternal & Fetal Medicine

## 2023-02-06 VITALS — BP 108/67 | HR 100 | Temp 98.2°F | Resp 14 | Ht 61.0 in | Wt 161.5 lb

## 2023-02-06 DIAGNOSIS — O43193 Other malformation of placenta, third trimester: Secondary | ICD-10-CM

## 2023-02-06 DIAGNOSIS — O09523 Supervision of elderly multigravida, third trimester: Secondary | ICD-10-CM

## 2023-02-06 DIAGNOSIS — O4440 Low lying placenta NOS or without hemorrhage, unspecified trimester: Secondary | ICD-10-CM

## 2023-02-06 DIAGNOSIS — O43199 Other malformation of placenta, unspecified trimester: Secondary | ICD-10-CM

## 2023-02-06 DIAGNOSIS — O0943 Supervision of pregnancy with grand multiparity, third trimester: Secondary | ICD-10-CM

## 2023-02-06 DIAGNOSIS — Z3A35 35 weeks gestation of pregnancy: Secondary | ICD-10-CM

## 2023-02-13 ENCOUNTER — Other Ambulatory Visit: Payer: Medicaid Other

## 2023-02-19 ENCOUNTER — Ambulatory Visit: Payer: Medicaid Other | Admitting: Advanced Practice Midwife

## 2023-02-19 VITALS — BP 116/79 | HR 87 | Temp 97.3°F | Wt 161.6 lb

## 2023-02-19 DIAGNOSIS — O43199 Other malformation of placenta, unspecified trimester: Secondary | ICD-10-CM

## 2023-02-19 DIAGNOSIS — F4323 Adjustment disorder with mixed anxiety and depressed mood: Secondary | ICD-10-CM

## 2023-02-19 DIAGNOSIS — O99013 Anemia complicating pregnancy, third trimester: Secondary | ICD-10-CM

## 2023-02-19 DIAGNOSIS — O0943 Supervision of pregnancy with grand multiparity, third trimester: Secondary | ICD-10-CM

## 2023-02-19 DIAGNOSIS — O09529 Supervision of elderly multigravida, unspecified trimester: Secondary | ICD-10-CM

## 2023-02-19 DIAGNOSIS — O09523 Supervision of elderly multigravida, third trimester: Secondary | ICD-10-CM

## 2023-02-19 DIAGNOSIS — O0940 Supervision of pregnancy with grand multiparity, unspecified trimester: Secondary | ICD-10-CM
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
## 2023-02-19 NOTE — Progress Notes (Signed)
Kept 02/06/23 [facility] [place] appt. Correctly verbalizes how to take iron BID and every day prenatal vitamin. Taking iron with juice. Reports iron still causing nausea and will occasionally vomit after taking. Encouraged to notify provider during today's appt and agrees to do so. 1 week MHC RV appt scheduled and reminder given to client. Jossie Ng, RN

## 2023-02-20 ENCOUNTER — Observation Stay
Admission: EM | Admit: 2023-02-20 | Discharge: 2023-02-20 | Disposition: A | Discharge status: Home / Self Care | Payer: Medicaid Other | Attending: Obstetrics and Gynecology | Admitting: Obstetrics and Gynecology

## 2023-02-20 DIAGNOSIS — O99891 Other specified diseases and conditions complicating pregnancy: Secondary | ICD-10-CM | POA: Diagnosis not present

## 2023-02-20 DIAGNOSIS — M545 Low back pain, unspecified: Secondary | ICD-10-CM | POA: Diagnosis not present

## 2023-02-20 DIAGNOSIS — O26893 Other specified pregnancy related conditions, third trimester: Secondary | ICD-10-CM | POA: Diagnosis not present

## 2023-02-20 DIAGNOSIS — O99013 Anemia complicating pregnancy, third trimester: Secondary | ICD-10-CM

## 2023-02-20 DIAGNOSIS — Z3A37 37 weeks gestation of pregnancy: Secondary | ICD-10-CM | POA: Insufficient documentation

## 2023-02-20 DIAGNOSIS — O09523 Supervision of elderly multigravida, third trimester: Secondary | ICD-10-CM | POA: Diagnosis not present

## 2023-02-20 DIAGNOSIS — O0943 Supervision of pregnancy with grand multiparity, third trimester: Secondary | ICD-10-CM | POA: Diagnosis not present

## 2023-02-20 LAB — URINALYSIS, COMPLETE (UACMP) WITH MICROSCOPIC
Bilirubin Urine: NEGATIVE
Glucose, UA: NEGATIVE mg/dL
Hgb urine dipstick: NEGATIVE
Ketones, ur: NEGATIVE mg/dL
Nitrite: NEGATIVE
Protein, ur: NEGATIVE mg/dL
Specific Gravity, Urine: 1.006 (ref 1.005–1.030)
pH: 6 (ref 5.0–8.0)

## 2023-02-20 LAB — WET PREP, GENITAL
Clue Cells Wet Prep HPF POC: NONE SEEN
Sperm: NONE SEEN
Trich, Wet Prep: NONE SEEN
WBC, Wet Prep HPF POC: <10 (ref <10)
Yeast Wet Prep HPF POC: NONE SEEN

## 2023-02-20 MED ORDER — ACETAMINOPHEN 325 MG PO TABS
650.0000 mg | ORAL_TABLET | Freq: Four times a day (QID) | ORAL | Status: AC | PRN
Start: 2023-02-20 — End: ?

## 2023-02-20 MED ORDER — IRON (FERROUS SULFATE) 325 (65 FE) MG PO TABS
325.0000 mg | ORAL_TABLET | Freq: Two times a day (BID) | ORAL | Status: AC
Start: 2023-02-20 — End: ?
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
## 2023-02-20 NOTE — OB Triage Note (Signed)
Pt complains of back pain that comes and goes. Pain started yesterday. Occasional tightening of abdomen. Denies any leaking of fluid or bleeding. Denies any symptoms of UTI. EFMs applied.

## 2023-02-27 ENCOUNTER — Ambulatory Visit: Payer: Medicaid Other | Admitting: Family Medicine

## 2023-02-27 ENCOUNTER — Other Ambulatory Visit: Payer: Self-pay | Admitting: Family Medicine

## 2023-02-27 DIAGNOSIS — O99013 Anemia complicating pregnancy, third trimester: Secondary | ICD-10-CM

## 2023-02-27 DIAGNOSIS — O0943 Supervision of pregnancy with grand multiparity, third trimester: Secondary | ICD-10-CM

## 2023-02-27 DIAGNOSIS — O0940 Supervision of pregnancy with grand multiparity, unspecified trimester: Secondary | ICD-10-CM

## 2023-02-27 LAB — HEMOGLOBIN, FINGERSTICK: Hemoglobin: 9.4 g/dL — ABNORMAL LOW (ref 11.1–15.9)

## 2023-02-27 NOTE — Patient Instructions (Signed)
It was wonderful to see you today. Thank you for allowing me to be a part of your care. Below is a short summary of what we discussed at your visit today:  Pregnancy  Keep taking your prenatal vitamins and aspirin every day! Come back in one week.  We'll see you every week until you deliver.   Maternal Mental Health If you start to develop the below symptoms of depression, please reach out to [place] for an appointment. There is also a Biomedical scientist Health Hotline at [phone]). This hotline has trained counselors, doulas, and midwifes to real-time support, information, and resources.  Feeling sad or hopeless most of the time Lack of interest in things you used to enjoy Less interest in caring for yourself (dressing, fixing hair) Trouble concentrating Trouble coping with daily tasks Constant worry about your baby Sleeping or eating too much or too little Feeling very anxious or nervous Unexplained irritability or anger Unwanted or scary thoughts Feeling that you are not a good mother Thoughts of hurting yourself or your baby  If you feel you are experiencing a mental health crisis, please reach out to the National Suicide Prevention Hotline at 1-800-273-TALK 516-588-2811).   Fayette Pho, MD [facility] Department

## 2023-02-27 NOTE — Progress Notes (Signed)
Correctly verbalizes how to take iron and prenatal vitamin as ordered. Taking iron tablet with juice. Hgb due 03/06/23. Declined self-collection of 36 week cultures. 1 week MHC RV appt scheduled and reminder card given. Jossie Ng, RN Hgb = 9.4 and Dr. Larita Fife aware. Per her verbal order, client counseled to take Ferrous sulfate 325mg  po every other day. Client so counseled with verbalized understanding. Jossie Ng, RN
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
## 2023-02-27 NOTE — Progress Notes (Signed)
Signed and held orders for IV infusion of feraheme 510 mg weekly x 2 doses.   Will call back [facility] same day surgery center tomorrow when the scheduler is on floor.  [phone].   [address] (same day surgery floor of [facility] hospital).   Fayette Pho, MD 02/27/23  5:02 PM

## 2023-02-28 ENCOUNTER — Telehealth: Payer: Self-pay | Admitting: Advanced Practice Midwife

## 2023-02-28 ENCOUNTER — Telehealth: Payer: Self-pay | Admitting: Family Medicine

## 2023-02-28 NOTE — Telephone Encounter (Signed)
has a question and wants a call

## 2023-02-28 NOTE — Telephone Encounter (Signed)
Patient returned call to HD. I spoke with her and provided details. Also provided same day surgery phone number for her to call and reschedule the IV iron infusion if needed.   Toni Pho, MD 02/28/23  4:10 PM

## 2023-02-28 NOTE — Telephone Encounter (Signed)
Call to client and left message to return call first thing in am when agency opened at 8:00 am. Number to call provided. Jossie Ng, RN

## 2023-02-28 NOTE — Telephone Encounter (Signed)
Called [facility] same day surgery center to schedule IV iron infusion. Feraheme 510 mg weekly x 2 doses ordered 10/23 for RN release. Given patient is currently [redacted]w[redacted]d gestation, we will try to iron infusions asap.   Appointment made for Wednesday, October 30 at 8:30 am. Arrive time recommended 10-15 minutes early.   Called patient, no answer, left VM. VM greeting positively identified Ms. Bohne, so I left appointment details including date, time, and location.   Fayette Pho, MD 02/28/23  3:34 PM

## 2023-03-01 NOTE — Assessment & Plan Note (Signed)
Doing well, endorses low back pain and occasional contractions. No fluid leakage or ROM. Tolerating pnv, asa, and PO iron well. Collected GBS, GC, and CT swabs today. Next appointment one week.

## 2023-03-01 NOTE — Assessment & Plan Note (Addendum)
Tolerating PO iron well. Unfortunately, fingerstick Hgb 9.4 today, down from 10.0 one month ago despite reported adherence to PO iron. Ferritin on 8/16 was 10, diagnostic of iron deficiency. Normal hemoglobin electrophoresis from 06/18/2007 noted in chart, thus r/o sickle cell disease or trait or a beta thalassemia. Given lack of response to oral iron, will attempt to schedule IV iron infusion with feraheme x2 if we can get this done before she delivers.

## 2023-03-03 DIAGNOSIS — O99820 Streptococcus B carrier state complicating pregnancy: Secondary | ICD-10-CM | POA: Insufficient documentation

## 2023-03-03 LAB — CULTURE, BETA STREP (GROUP B ONLY): Strep Gp B Culture: POSITIVE — AB

## 2023-03-03 LAB — CHLAMYDIA/GC NAA, CONFIRMATION
Chlamydia trachomatis, NAA: NEGATIVE
Neisseria gonorrhoeae, NAA: NEGATIVE

## 2023-03-05 ENCOUNTER — Ambulatory Visit: Payer: Medicaid Other | Admitting: Advanced Practice Midwife

## 2023-03-05 VITALS — BP 114/73 | HR 86 | Temp 97.1°F | Wt 159.8 lb

## 2023-03-05 DIAGNOSIS — O09529 Supervision of elderly multigravida, unspecified trimester: Secondary | ICD-10-CM

## 2023-03-05 DIAGNOSIS — O99013 Anemia complicating pregnancy, third trimester: Secondary | ICD-10-CM

## 2023-03-05 DIAGNOSIS — O0943 Supervision of pregnancy with grand multiparity, third trimester: Secondary | ICD-10-CM

## 2023-03-05 DIAGNOSIS — O0940 Supervision of pregnancy with grand multiparity, unspecified trimester: Secondary | ICD-10-CM

## 2023-03-05 DIAGNOSIS — F4323 Adjustment disorder with mixed anxiety and depressed mood: Secondary | ICD-10-CM

## 2023-03-05 DIAGNOSIS — O09523 Supervision of elderly multigravida, third trimester: Secondary | ICD-10-CM
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
## 2023-03-05 NOTE — Progress Notes (Addendum)
Reports taking iron tablet every other day, PNV daily. Taking iron with juice. Aware of [facility] iron infusion appt 03/06/23 at 0830. Jossie Ng, RN IOL referral, snapshot pages, overview box, all [place] reports and initial encounter and 03/05/23 encounter printed and faxed to [facility]. Unable to get fax to go through at this time. Call to [facility] and spoke with Kyung Rudd to ascertain if another fax number available and there is not. Will re-attempt faxing of above to [facility]. Jossie Ng, RN Re-attempted fax late pm as able to get another fax to go through to [facility]. However, fax cut off at page 41 and will re-fax IOL referral with records tomorrow. Jossie Ng, RN

## 2023-03-06 ENCOUNTER — Ambulatory Visit
Admission: RE | Admit: 2023-03-06 | Discharge: 2023-03-06 | Disposition: A | Discharge status: Home / Self Care | Payer: Medicaid Other | Source: Ambulatory Visit | Attending: Family Medicine | Admitting: Family Medicine

## 2023-03-06 DIAGNOSIS — O99013 Anemia complicating pregnancy, third trimester: Secondary | ICD-10-CM | POA: Insufficient documentation

## 2023-03-06 DIAGNOSIS — D649 Anemia, unspecified: Secondary | ICD-10-CM | POA: Insufficient documentation

## 2023-03-06 DIAGNOSIS — Z3A39 39 weeks gestation of pregnancy: Secondary | ICD-10-CM | POA: Diagnosis not present

## 2023-03-06 MED ORDER — SODIUM CHLORIDE 0.9 % IV SOLN
510.0000 mg | INTRAVENOUS | Status: DC
  Administered 2023-03-06: 510 mg via INTRAVENOUS
  Filled 2023-03-06: qty 17

## 2023-03-06 NOTE — Progress Notes (Addendum)
Patient was scheduled to receive an infusion at 8:30, no show/no call. Called 3x, no answer, unable to leave message.Informed Charge Nurse  Patient arrived, no cell service.

## 2023-03-11 ENCOUNTER — Encounter: Payer: Self-pay | Admitting: Obstetrics and Gynecology

## 2023-03-11 ENCOUNTER — Inpatient Hospital Stay
Admission: EM | Admit: 2023-03-11 | Discharge: 2023-03-13 | DRG: 807 | Disposition: A | Discharge status: Home / Self Care | Payer: Medicaid Other | Attending: Obstetrics | Admitting: Obstetrics

## 2023-03-11 ENCOUNTER — Other Ambulatory Visit: Payer: Self-pay

## 2023-03-11 DIAGNOSIS — Z3A39 39 weeks gestation of pregnancy: Secondary | ICD-10-CM

## 2023-03-11 DIAGNOSIS — O4440 Low lying placenta NOS or without hemorrhage, unspecified trimester: Secondary | ICD-10-CM

## 2023-03-11 DIAGNOSIS — O479 False labor, unspecified: Secondary | ICD-10-CM | POA: Diagnosis present

## 2023-03-11 DIAGNOSIS — Z87410 Personal history of cervical dysplasia: Secondary | ICD-10-CM

## 2023-03-11 DIAGNOSIS — O9902 Anemia complicating childbirth: Secondary | ICD-10-CM | POA: Diagnosis present

## 2023-03-11 DIAGNOSIS — Z833 Family history of diabetes mellitus: Secondary | ICD-10-CM

## 2023-03-11 DIAGNOSIS — O99824 Streptococcus B carrier state complicating childbirth: Secondary | ICD-10-CM | POA: Diagnosis present

## 2023-03-11 DIAGNOSIS — Z806 Family history of leukemia: Secondary | ICD-10-CM

## 2023-03-11 DIAGNOSIS — O43123 Velamentous insertion of umbilical cord, third trimester: Principal | ICD-10-CM | POA: Diagnosis present

## 2023-03-11 DIAGNOSIS — Z825 Family history of asthma and other chronic lower respiratory diseases: Secondary | ICD-10-CM

## 2023-03-11 DIAGNOSIS — Z808 Family history of malignant neoplasm of other organs or systems: Secondary | ICD-10-CM

## 2023-03-11 DIAGNOSIS — Z87440 Personal history of urinary (tract) infections: Secondary | ICD-10-CM

## 2023-03-11 DIAGNOSIS — Z8249 Family history of ischemic heart disease and other diseases of the circulatory system: Secondary | ICD-10-CM

## 2023-03-11 DIAGNOSIS — Z8261 Family history of arthritis: Secondary | ICD-10-CM

## 2023-03-11 DIAGNOSIS — Z7982 Long term (current) use of aspirin: Secondary | ICD-10-CM

## 2023-03-11 DIAGNOSIS — O43199 Other malformation of placenta, unspecified trimester: Principal | ICD-10-CM

## 2023-03-11 DIAGNOSIS — O99820 Streptococcus B carrier state complicating pregnancy: Secondary | ICD-10-CM

## 2023-03-11 LAB — CBC
HCT: 35.4 % — ABNORMAL LOW (ref 36.0–46.0)
Hemoglobin: 11.1 g/dL — ABNORMAL LOW (ref 12.0–15.0)
MCH: 25.9 pg — ABNORMAL LOW (ref 26.0–34.0)
MCHC: 31.4 g/dL (ref 30.0–36.0)
MCV: 82.7 fL (ref 80.0–100.0)
Platelets: 245 10*3/uL (ref 150–400)
RBC: 4.28 MIL/uL (ref 3.87–5.11)
RDW: 17.4 % — ABNORMAL HIGH (ref 11.5–15.5)
WBC: 8.4 10*3/uL (ref 4.0–10.5)
nRBC: 0 % (ref 0.0–0.2)

## 2023-03-11 LAB — TYPE AND SCREEN
ABO/RH(D): O POS
Antibody Screen: NEGATIVE

## 2023-03-11 MED ORDER — OXYTOCIN BOLUS FROM INFUSION
333.0000 mL | Freq: Once | INTRAVENOUS | Status: AC
  Administered 2023-03-12: 333 mL via INTRAVENOUS

## 2023-03-11 MED ORDER — DOCUSATE SODIUM 100 MG PO CAPS
100.0000 mg | ORAL_CAPSULE | Freq: Every day | ORAL | Status: DC
  Filled 2023-03-11: qty 1

## 2023-03-11 MED ORDER — LIDOCAINE HCL (PF) 1 % IJ SOLN
30.0000 mL | INTRAMUSCULAR | Status: AC | PRN
  Administered 2023-03-12: 30 mL via SUBCUTANEOUS
  Filled 2023-03-11: qty 30

## 2023-03-11 MED ORDER — ACETAMINOPHEN 500 MG PO TABS: 1000.0000 mg | ORAL_TABLET | Freq: Four times a day (QID) | ORAL | Status: DC | PRN

## 2023-03-11 MED ORDER — SODIUM CHLORIDE 0.9 % IV SOLN
5.0000 10*6.[IU] | Freq: Once | INTRAVENOUS | Status: AC
  Administered 2023-03-11: 5 10*6.[IU] via INTRAVENOUS
  Filled 2023-03-11: qty 5

## 2023-03-11 MED ORDER — OXYTOCIN-SODIUM CHLORIDE 30-0.9 UT/500ML-% IV SOLN
2.5000 [IU]/h | INTRAVENOUS | Status: DC
  Administered 2023-03-12: 2.5 [IU]/h via INTRAVENOUS
  Filled 2023-03-11: qty 500

## 2023-03-11 MED ORDER — SOD CITRATE-CITRIC ACID 500-334 MG/5ML PO SOLN: 30.0000 mL | ORAL | Status: DC | PRN

## 2023-03-11 MED ORDER — FENTANYL CITRATE (PF) 100 MCG/2ML IJ SOLN: 50.0000 ug | INTRAMUSCULAR | Status: DC | PRN

## 2023-03-11 MED ORDER — ZOLPIDEM TARTRATE 5 MG PO TABS: 5.0000 mg | ORAL_TABLET | Freq: Every evening | ORAL | Status: DC | PRN

## 2023-03-11 MED ORDER — LACTATED RINGERS IV SOLN: 500.0000 mL | INTRAVENOUS | Status: DC | PRN

## 2023-03-11 MED ORDER — AMMONIA AROMATIC IN INHA
RESPIRATORY_TRACT | Status: AC
  Filled 2023-03-11: qty 10

## 2023-03-11 MED ORDER — ONDANSETRON HCL 4 MG/2ML IJ SOLN: 4.0000 mg | Freq: Four times a day (QID) | INTRAMUSCULAR | Status: DC | PRN

## 2023-03-11 MED ORDER — LACTATED RINGERS IV SOLN: INTRAVENOUS | Status: DC

## 2023-03-11 MED ORDER — OXYTOCIN 10 UNIT/ML IJ SOLN
INTRAMUSCULAR | Status: AC
  Filled 2023-03-11: qty 2

## 2023-03-11 MED ORDER — PRENATAL MULTIVITAMIN CH: 1.0000 | ORAL_TABLET | Freq: Every day | ORAL | Status: DC

## 2023-03-11 MED ORDER — PENICILLIN G POT IN DEXTROSE 60000 UNIT/ML IV SOLN
3.0000 10*6.[IU] | INTRAVENOUS | Status: DC
Start: 2023-03-12 — End: 2023-03-12
  Administered 2023-03-12: 3 10*6.[IU] via INTRAVENOUS
  Filled 2023-03-11: qty 50

## 2023-03-11 MED ORDER — CALCIUM CARBONATE ANTACID 500 MG PO CHEW: 2.0000 | CHEWABLE_TABLET | ORAL | Status: DC | PRN

## 2023-03-11 MED ORDER — MISOPROSTOL 200 MCG PO TABS
ORAL_TABLET | ORAL | Status: AC
  Filled 2023-03-11: qty 4

## 2023-03-11 NOTE — OB Triage Note (Signed)
37 y.o G7P5106 presents at [redacted]w[redacted]d w c/o contractions that began today and have become more painful this afternoon. Pt denies lof, vaginal bleeding, and endorses fetal movement. Pt unsure of ctx frequency. Monitors applied and assessing. Pt is GBS+ and has a h/o anemia. Rubye Oaks, CNM notified of pt arrival and POC includes 2 hour labor eval. Cervix was 1.5/60/-3 at 1830.
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
## 2023-03-12 ENCOUNTER — Encounter: Payer: Self-pay | Admitting: Obstetrics and Gynecology

## 2023-03-12 ENCOUNTER — Ambulatory Visit: Payer: Medicaid Other

## 2023-03-12 DIAGNOSIS — Z3A40 40 weeks gestation of pregnancy: Secondary | ICD-10-CM | POA: Diagnosis not present

## 2023-03-12 DIAGNOSIS — Z806 Family history of leukemia: Secondary | ICD-10-CM | POA: Diagnosis not present

## 2023-03-12 DIAGNOSIS — Z808 Family history of malignant neoplasm of other organs or systems: Secondary | ICD-10-CM | POA: Diagnosis not present

## 2023-03-12 DIAGNOSIS — O26893 Other specified pregnancy related conditions, third trimester: Secondary | ICD-10-CM | POA: Diagnosis present

## 2023-03-12 DIAGNOSIS — O43123 Velamentous insertion of umbilical cord, third trimester: Secondary | ICD-10-CM | POA: Diagnosis present

## 2023-03-12 DIAGNOSIS — Z8249 Family history of ischemic heart disease and other diseases of the circulatory system: Secondary | ICD-10-CM | POA: Diagnosis not present

## 2023-03-12 DIAGNOSIS — Z833 Family history of diabetes mellitus: Secondary | ICD-10-CM | POA: Diagnosis not present

## 2023-03-12 DIAGNOSIS — O0993 Supervision of high risk pregnancy, unspecified, third trimester: Secondary | ICD-10-CM | POA: Diagnosis not present

## 2023-03-12 DIAGNOSIS — Z7982 Long term (current) use of aspirin: Secondary | ICD-10-CM | POA: Diagnosis not present

## 2023-03-12 DIAGNOSIS — Z3A39 39 weeks gestation of pregnancy: Secondary | ICD-10-CM | POA: Diagnosis not present

## 2023-03-12 DIAGNOSIS — O99824 Streptococcus B carrier state complicating childbirth: Secondary | ICD-10-CM | POA: Diagnosis present

## 2023-03-12 DIAGNOSIS — Z87410 Personal history of cervical dysplasia: Secondary | ICD-10-CM | POA: Diagnosis not present

## 2023-03-12 DIAGNOSIS — Z87440 Personal history of urinary (tract) infections: Secondary | ICD-10-CM | POA: Diagnosis not present

## 2023-03-12 DIAGNOSIS — Z8261 Family history of arthritis: Secondary | ICD-10-CM | POA: Diagnosis not present

## 2023-03-12 DIAGNOSIS — Z825 Family history of asthma and other chronic lower respiratory diseases: Secondary | ICD-10-CM | POA: Diagnosis not present

## 2023-03-12 DIAGNOSIS — O9902 Anemia complicating childbirth: Secondary | ICD-10-CM | POA: Diagnosis present

## 2023-03-12 LAB — RPR: RPR Ser Ql: NONREACTIVE

## 2023-03-12 MED ORDER — DIPHENHYDRAMINE HCL 25 MG PO CAPS: 25.0000 mg | ORAL_CAPSULE | Freq: Four times a day (QID) | ORAL | Status: DC | PRN

## 2023-03-12 MED ORDER — SODIUM CHLORIDE 0.9% FLUSH: 3.0000 mL | Freq: Two times a day (BID) | INTRAVENOUS | Status: DC

## 2023-03-12 MED ORDER — BENZOCAINE-MENTHOL 20-0.5 % EX AERO
1.0000 | INHALATION_SPRAY | CUTANEOUS | Status: DC | PRN
  Administered 2023-03-12: 1 via TOPICAL
  Filled 2023-03-12: qty 56

## 2023-03-12 MED ORDER — FLEET ENEMA RE ENEM: 1.0000 | ENEMA | Freq: Every day | RECTAL | Status: DC | PRN

## 2023-03-12 MED ORDER — OXYTOCIN-SODIUM CHLORIDE 30-0.9 UT/500ML-% IV SOLN: 1.0000 m[IU]/min | INTRAVENOUS | Status: DC

## 2023-03-12 MED ORDER — SODIUM CHLORIDE 0.9% FLUSH: 3.0000 mL | INTRAVENOUS | Status: DC | PRN

## 2023-03-12 MED ORDER — ONDANSETRON HCL 4 MG/2ML IJ SOLN: 4.0000 mg | INTRAMUSCULAR | Status: DC | PRN

## 2023-03-12 MED ORDER — SIMETHICONE 80 MG PO CHEW: 80.0000 mg | CHEWABLE_TABLET | ORAL | Status: DC | PRN

## 2023-03-12 MED ORDER — SENNOSIDES-DOCUSATE SODIUM 8.6-50 MG PO TABS
2.0000 | ORAL_TABLET | ORAL | Status: DC
  Administered 2023-03-13: 2 via ORAL
  Filled 2023-03-12 (×2): qty 2

## 2023-03-12 MED ORDER — ONDANSETRON HCL 4 MG PO TABS: 4.0000 mg | ORAL_TABLET | ORAL | Status: DC | PRN

## 2023-03-12 MED ORDER — TERBUTALINE SULFATE 1 MG/ML IJ SOLN: 0.2500 mg | Freq: Once | INTRAMUSCULAR | Status: DC | PRN

## 2023-03-12 MED ORDER — PRENATAL MULTIVITAMIN CH
1.0000 | ORAL_TABLET | Freq: Every day | ORAL | Status: DC
  Administered 2023-03-12: 1 via ORAL
  Filled 2023-03-12 (×2): qty 1

## 2023-03-12 MED ORDER — SODIUM CHLORIDE 0.9 % IV SOLN: 250.0000 mL | INTRAVENOUS | Status: DC | PRN

## 2023-03-12 MED ORDER — ACETAMINOPHEN 325 MG PO TABS: 650.0000 mg | ORAL_TABLET | ORAL | Status: DC | PRN

## 2023-03-12 MED ORDER — ZOLPIDEM TARTRATE 5 MG PO TABS: 5.0000 mg | ORAL_TABLET | Freq: Every evening | ORAL | Status: DC | PRN

## 2023-03-12 MED ORDER — DIBUCAINE (PERIANAL) 1 % EX OINT
1.0000 | TOPICAL_OINTMENT | CUTANEOUS | Status: DC | PRN
  Administered 2023-03-12: 1 via RECTAL
  Filled 2023-03-12: qty 28

## 2023-03-12 MED ORDER — BISACODYL 10 MG RE SUPP: 10.0000 mg | Freq: Every day | RECTAL | Status: DC | PRN

## 2023-03-12 MED ORDER — COCONUT OIL OIL: 1.0000 | TOPICAL_OIL | Status: DC | PRN

## 2023-03-12 MED ORDER — IBUPROFEN 600 MG PO TABS
600.0000 mg | ORAL_TABLET | Freq: Four times a day (QID) | ORAL | Status: DC
  Filled 2023-03-12 (×2): qty 1

## 2023-03-12 MED ORDER — WITCH HAZEL-GLYCERIN EX PADS
1.0000 | MEDICATED_PAD | CUTANEOUS | Status: DC | PRN
  Administered 2023-03-12: 1 via TOPICAL
  Filled 2023-03-12: qty 100

## 2023-03-12 MED ORDER — OXYCODONE HCL 5 MG PO TABS: 5.0000 mg | ORAL_TABLET | ORAL | Status: DC | PRN

## 2023-03-12 MED ORDER — SENNOSIDES-DOCUSATE SODIUM 8.6-50 MG PO TABS: 2.0000 | ORAL_TABLET | ORAL | Status: DC

## 2023-03-12 NOTE — Progress Notes (Signed)
L&D Note    Subjective:  has no unusual complaints  Objective:   Vitals:   03/11/23 1827 03/11/23 2110 03/12/23 0045 03/12/23 0046  BP:  121/83 105/67 105/67  Pulse:  (!) 102 100 100  Resp:  18 16   Temp: 98.5 F (36.9 C) 98.2 F (36.8 C) 98.4 F (36.9 C)   TempSrc: Oral Oral Oral   Weight: 72.5 kg     Height: 5' (1.524 m)       Current Vital Signs 24h Vital Sign Ranges  T 98.4 F (36.9 C) Temp  Avg: 98.4 F (36.9 C)  Min: 98.2 F (36.8 C)  Max: 98.5 F (36.9 C)  BP 105/67 BP  Min: 105/67  Max: 121/83  HR 100 Pulse  Avg: 101.3  Min: 100  Max: 103  RR 16 Resp  Avg: 17  Min: 16  Max: 18  SaO2     No data recorded      Gen: alert, cooperative, no distress FHR: Baseline: 135 bpm, Variability: moderate, Accels: Present, Decels: none Toco: regular, every 1.5-4 minutes SVE: Dilation: 6.5 Effacement (%): 70 Cervical Position: Middle Station: -2, -1 Presentation: Vertex Exam by:: Rubye Oaks CNM  Medications SCHEDULED MEDICATIONS   ammonia       docusate sodium  100 mg Oral Daily   misoprostol       oxytocin       oxytocin 40 units in LR 1000 mL  333 mL Intravenous Once   prenatal multivitamin  1 tablet Oral Q1200    MEDICATION INFUSIONS   lactated ringers     lactated ringers 125 mL/hr at 03/11/23 2056   oxytocin     pencillin G potassium IV 3 Million Units (03/12/23 0045)    PRN MEDICATIONS  acetaminophen, ammonia, calcium carbonate, fentaNYL (SUBLIMAZE) injection, lactated ringers, lidocaine (PF), misoprostol, ondansetron, oxytocin, sodium citrate-citric acid, zolpidem   Assessment & Plan:  37 y.o. [id number] at [redacted]w[redacted]d admitted for labor -Labor: Active phase labor. and Dysfunctional uterine contractions. -Fetal Well-being: Category I -GBS: positive, PCN x 2 doses -Membranes ruptured, scant fluid -Intervention: IV Pitocin augmentation, change maternal position, and anticipate vaginal delivery -Analgesia: unmedicated labor support options    Chari Manning,  CNM  03/12/2023 2:47 AM  [facility]

## 2023-03-12 NOTE — Discharge Summary (Signed)
Postpartum Discharge Summary  Patient Name: Toni Black DOB: 1985-07-11 MRN: [id number]  Date of admission: 03/11/2023 Delivery date:03/12/2023 Delivering provider: Chari Manning Date of discharge: 03/13/2023  Primary OB: [facility] QIH:[id number]'G last menstrual period was 06/21/2022 (approximate). EDC Estimated Date of Delivery: 03/12/23 Gestational Age at Delivery: [redacted]w[redacted]d   Admitting diagnosis: Uterine contractions [O47.9] Intrauterine pregnancy: [redacted]w[redacted]d     Secondary diagnosis:   Active Problems:   Uterine contractions   Discharge Diagnosis: Term Pregnancy Delivered and Anemia      Hospital course: Onset of Labor With Vaginal Delivery      37 y.o. yo [id number] at [redacted]w[redacted]d was admitted in Latent Labor on 03/11/2023. Labor course was uncomplicated  Membrane Rupture Time/Date: 2:25 AM,03/12/2023  Delivery Method:Vaginal, Spontaneous Operative Delivery:N/A Episiotomy: None Lacerations:  2nd degree;Vaginal Patient had a postpartum course complicated by none.  She is ambulating, tolerating a regular diet, passing flatus, and urinating well. Patient is discharged home in stable condition on 03/13/23.  Newborn Data: Birth date:03/12/2023 Birth time:3:28 AM Gender:Female"Josiah" Living status:Living Apgars:8 ,9  Weight:3310 g                                            Post partum procedures: None Augmentation:: AROM Complications: None Delivery Type: spontaneous vaginal delivery Anesthesia: non-pharmacological methods Placenta: spontaneous To Pathology: No   Prenatal Labs:   Blood type/Rh O   Antibody screen Negative    Rubella 13.10 (05/02 1022)   Varicella Immune  RPR Non Reactive (08/16 1629)   HBsAg Negative (05/02 1022)  Hep C NR   HIV Non Reactive (05/02 1022)   GC neg  Chlamydia neg  Genetic screening cfDNA negative   1 hour GTT 85  3 hour GTT N/a  GBS Positive/-- (10/23 1634)      Magnesium Sulfate received: No BMZ received: No Rhophylac:was not  indicated MMR: was not indicated Varivax vaccine given: was not indicated T-DaP:Given prenatally Flu:  declined RSV: did not receive  Transfusion:No  Physical exam  Vitals:   03/12/23 1506 03/12/23 1917 03/12/23 2259 03/13/23 0753  BP: 121/75 120/70 100/67 104/70  Pulse: 88 85 92 94  Resp: 18 18 18 20   Temp: 98.4 F (36.9 C) 99.4 F (37.4 C) 98.4 F (36.9 C) 98.9 F (37.2 C)  TempSrc: Oral Oral Oral Oral  SpO2: 100% 100% 99% 99%  Weight:      Height:       General: alert, cooperative, and no distress Lochia: appropriate Uterine Fundus: firm Perineum:minimal edema/repair well approximated DVT Evaluation: No evidence of DVT seen on physical exam.  Labs: Lab Results  Component Value Date   WBC 12.7 (H) 03/13/2023   HGB 10.6 (L) 03/13/2023   HCT 33.3 (L) 03/13/2023   MCV 81.2 03/13/2023   PLT 236 03/13/2023      Latest Ref Rng & Units 01/11/2021    9:24 AM  CMP  Glucose 70 - 99 mg/dL 76   BUN 6 - 20 mg/dL 5   Creatinine 3.29 - 5.18 mg/dL 8.41   Sodium 660 - 630 mmol/L 137   Potassium 3.5 - 5.1 mmol/L 3.8   Chloride 98 - 111 mmol/L 112   CO2 22 - 32 mmol/L 19   Calcium 8.9 - 10.3 mg/dL 8.2   Total Protein 6.5 - 8.1 g/dL 5.9   Total Bilirubin 0.3 - 1.2 mg/dL 0.6   Alkaline Phos  38 - 126 U/L 161   AST 15 - 41 U/L 17   ALT 0 - 44 U/L 12    Edinburgh Score:    03/12/2023    8:50 AM  Edinburgh Postnatal Depression Scale Screening Tool  I have been able to laugh and see the funny side of things. 0  I have looked forward with enjoyment to things. 0  I have blamed myself unnecessarily when things went wrong. 0  I have been anxious or worried for no good reason. 0  I have felt scared or panicky for no good reason. 0  Things have been getting on top of me. 0  I have been so unhappy that I have had difficulty sleeping. 0  I have felt sad or miserable. 0  I have been so unhappy that I have been crying. 0  The thought of harming myself has occurred to me. 0   Edinburgh Postnatal Depression Scale Total 0    Risk assessment for postpartum VTE and prophylactic treatment: Very high risk factors: None High risk factors: None Moderate risk factors: BMI 30-40 kg/m2  Postpartum VTE prophylaxis with LMWH not indicated  After visit meds:  Allergies as of 03/13/2023   No Known Allergies      Medication List     STOP taking these medications    aspirin 81 MG chewable tablet       TAKE these medications    acetaminophen 325 MG tablet Commonly known as: Tylenol Take 2 tablets (650 mg total) by mouth every 6 (six) hours as needed for mild pain (pain score 1-3) or moderate pain (pain score 4-6).   ibuprofen 600 MG tablet Commonly known as: ADVIL Take 1 tablet (600 mg total) by mouth every 6 (six) hours as needed for mild pain (pain score 1-3), moderate pain (pain score 4-6) or cramping.   Iron (Ferrous Sulfate) 325 (65 Fe) MG Tabs Take 325 mg by mouth in the morning and at bedtime.   multivitamin-prenatal 27-0.8 MG Tabs tablet Take 1 tablet by mouth daily at 12 noon.       Discharge home in stable condition Infant Feeding: Bottle Infant Disposition:home with mother Discharge instruction: per After Visit Summary and Postpartum booklet. Activity: Advance as tolerated. Pelvic rest for 6 weeks.  Diet: routine diet Anticipated Birth Control: Unsure - Reviewed that if she desires a postpartum BTL then she would need to schedule a consult at [facility] and her insurance requires a 30 day waiting period from the time the consent is signed. She is not ready to sign the consent at this time.  Postpartum Appointment:6 weeks Additional Postpartum F/U: Postpartum Depression checkup Future Appointments: No future appointments.  Follow up Visit:  Follow-up Information     [facility] DEPT Follow up in 2 week(s).   Why: mood check Contact information: [address] [place]  [address] [phone]        [facility] HEALTH DEPT Follow up in 6 week(s).   Why: pp visit Contact information: [address] [place] [address] [phone]        [facility]. Schedule an appointment as soon as possible for a visit.   Why: for consult for tubal ligation if desired. Can be scheduled with any of the OB/GYN's at our practice. Contact information: [address] [place] [address] [phone]  Plan:  Brileigh Sevcik was discharged to home in good condition. Follow-up appointment as directed.    Signed:  Margaretmary Eddy, CNM Certified Nurse Midwife [facility]

## 2023-03-12 NOTE — Lactation Note (Signed)
This note was copied from a baby's chart. Lactation Consultation Note  Patient Name: Toni Black NWGNF'A Date: 03/12/2023 Age:36 hours Reason for consult: Initial assessment;Term   Maternal Data This is mom's 7th baby, SVD. Mom with history of anemia and AMA.  On initial visit mom reports she has formula fed all her other children and wanted to try breastfeeding and see what it was like. She reports she breastfed after this baby was born and subsequently formula fed. Per mom her plan going forward is to formula feed. Mom has some interest in trying use of a manual breastpump to be able to give some breastmilk .   Has patient been taught Hand Expression?:  (Mom agreeable to try use of a breastpump rather than hand express. Mom will try the hand pump. If she likes use of the pump she will pump and provide baby some milk in addition to formula.) Does the patient have breastfeeding experience prior to this delivery?: No (Per mom she formula fed her other 6 children. She wanted to trybreastfeeding which she did after this baby was born however she has decided to formula feed.)  Feeding Mother's current feeding choice : Formula and possible some pumped breastmilk.   Lactation Tools Discussed/Used  Provided Harmony Manual pump: set-up, use, cleaning of parts, milk storage guidelines.  Interventions Interventions: Hand pump;Education  Discharge Discharge Education: Engorgement and breast care;Warning signs for feeding baby;Outpatient recommendation Pump: Manual  Consult Status Consult Status: PRN  Update provided to care nurse.  Fuller Song 03/12/2023, 1:45 PM

## 2023-03-13 ENCOUNTER — Telehealth: Payer: Self-pay | Admitting: Family Medicine

## 2023-03-13 ENCOUNTER — Telehealth: Payer: Self-pay

## 2023-03-13 ENCOUNTER — Encounter: Payer: Self-pay | Admitting: Obstetrics and Gynecology

## 2023-03-13 LAB — CBC
HCT: 33.3 % — ABNORMAL LOW (ref 36.0–46.0)
Hemoglobin: 10.6 g/dL — ABNORMAL LOW (ref 12.0–15.0)
MCH: 25.9 pg — ABNORMAL LOW (ref 26.0–34.0)
MCHC: 31.8 g/dL (ref 30.0–36.0)
MCV: 81.2 fL (ref 80.0–100.0)
Platelets: 236 10*3/uL (ref 150–400)
RBC: 4.1 MIL/uL (ref 3.87–5.11)
RDW: 17.5 % — ABNORMAL HIGH (ref 11.5–15.5)
WBC: 12.7 10*3/uL — ABNORMAL HIGH (ref 4.0–10.5)
nRBC: 0 % (ref 0.0–0.2)

## 2023-03-13 MED ORDER — IBUPROFEN 600 MG PO TABS: 600.0000 mg | ORAL_TABLET | Freq: Four times a day (QID) | ORAL | 0 refills | Status: AC | PRN

## 2023-03-13 NOTE — Discharge Instructions (Signed)
Vaginal Delivery, Care After Refer to this sheet in the next few weeks. These discharge instructions provide you with information on caring for yourself after delivery. Your caregiver may also give you specific instructions. Your treatment has been planned according to the most current medical practices available, but problems sometimes occur. Call your caregiver if you have any problems or questions after you go home. HOME CARE INSTRUCTIONS Take over-the-counter or prescription medicines only as directed by your caregiver or pharmacist. Do not drink alcohol, especially if you are breastfeeding or taking medicine to relieve pain. Do not smoke tobacco. Continue to use good perineal care. Good perineal care includes: Wiping your perineum from back to front Keeping your perineum clean. You can do sitz baths twice a day, to help keep this area clean Do not use tampons, douche or have sex until your caregiver says it is okay. Shower only and avoid sitting in submerged water, aside from sitz baths Wear a well-fitting bra that provides breast support. Eat healthy foods. Drink enough fluids to keep your urine clear or pale yellow. Eat high-fiber foods such as whole grain cereals and breads, brown rice, beans, and fresh fruits and vegetables every day. These foods may help prevent or relieve constipation. Avoid constipation with high fiber foods or medications, such as miralax or metamucil Follow your caregiver's recommendations regarding resumption of activities such as climbing stairs, driving, lifting, exercising, or traveling. Talk to your caregiver about resuming sexual activities. Resumption of sexual activities is dependent upon your risk of infection, your rate of healing, and your comfort and desire to resume sexual activity. Try to have someone help you with your household activities and your newborn for at least a few days after you leave the hospital. Rest as much as possible. Try to rest or  take a nap when your newborn is sleeping. Increase your activities gradually. Keep all of your scheduled postpartum appointments. It is very important to keep your scheduled follow-up appointments. At these appointments, your caregiver will be checking to make sure that you are healing physically and emotionally. SEEK MEDICAL CARE IF:  You are passing large clots from your vagina. Save any clots to show your caregiver. You have a foul smelling discharge from your vagina. You have trouble urinating. You are urinating frequently. You have pain when you urinate. You have a change in your bowel movements. You have increasing redness, pain, or swelling near your vaginal incision (episiotomy) or vaginal tear. You have pus draining from your episiotomy or vaginal tear. Your episiotomy or vaginal tear is separating. You have painful, hard, or reddened breasts. You have a severe headache. You have blurred vision or see spots. You feel sad or depressed. You have thoughts of hurting yourself or your newborn. You have questions about your care, the care of your newborn, or medicines. You are dizzy or light-headed. You have a rash. You have nausea or vomiting. You were breastfeeding and have not had a menstrual period within 12 weeks after you stopped breastfeeding. You are not breastfeeding and have not had a menstrual period by the 12th week after delivery. You have a fever. SEEK IMMEDIATE MEDICAL CARE IF:  You have persistent pain. You have chest pain. You have shortness of breath. You faint. You have leg pain. You have stomach pain. Your vaginal bleeding saturates two or more sanitary pads in 1 hour. MAKE SURE YOU:  Understand these instructions. Will watch your condition. Will get help right away if you are not doing well or   get worse. Document Released: 04/20/2000 Document Revised: 09/07/2013 Document Reviewed: 12/19/2011 ExitCare Patient Information 2015 [employer]. This  information is not intended to replace advice given to you by your health care provider. Make sure you discuss any questions you have with your health care provider.  Sitz Bath A sitz bath is a warm water bath taken in the sitting position. The water covers only the hips and butt (buttocks). We recommend using one that fits in the toilet, to help with ease of use and cleanliness. It may be used for either healing or cleaning purposes. Sitz baths are also used to relieve pain, itching, or muscle tightening (spasms). The water may contain medicine. Moist heat will help you heal and relax.  HOME CARE  Take 3 to 4 sitz baths a day. Fill the bathtub half-full with warm water. Sit in the water and open the drain a little. Turn on the warm water to keep the tub half-full. Keep the water running constantly. Soak in the water for 15 to 20 minutes. After the sitz bath, pat the affected area dry. GET HELP RIGHT AWAY IF: You get worse instead of better. Stop the sitz baths if you get worse. MAKE SURE YOU: Understand these instructions. Will watch your condition. Will get help right away if you are not doing well or get worse. Document Released: 05/31/2004 Document Revised: 01/16/2012 Document Reviewed: 08/21/2010 ExitCare Patient Information 2015 [employer]. This information is not intended to replace advice given to you by your health care provider. Make sure you discuss any questions you have with your health care provider.   

## 2023-03-13 NOTE — Telephone Encounter (Signed)
Per secure chat from clerk, client trying to contact [facility], but call dropped. Call to client and left message requesting she call [facility] and number to call provided.Left message stating congratulations on the birth of her baby.Jossie Ng, RN

## 2023-03-22 NOTE — Telephone Encounter (Signed)
Called to check on patient since she had not returned [facility] phone call. Patient states is doing "well" and agreed to schedule postpartum visit. BTHIELE RN

## 2023-04-24 ENCOUNTER — Encounter: Payer: Medicaid Other | Admitting: Family Medicine

## 2023-04-24 NOTE — Addendum Note (Signed)
Addended by: Heywood Bene on: 04/24/2023 03:28 PM   Modules accepted: Orders

## 2023-04-24 NOTE — Progress Notes (Signed)
Erroneous. Disregard.

## 2023-04-24 NOTE — Progress Notes (Signed)
Patient unable to stay during visit...stated there was a family emergency. Patient rescheduled for postpartum visit 05/07/2023. BTHIELE RN

## 2023-05-07 ENCOUNTER — Ambulatory Visit: Payer: Medicaid Other

## 2023-05-13 ENCOUNTER — Telehealth: Payer: Self-pay

## 2023-05-13 NOTE — Telephone Encounter (Signed)
 Due to scheduling of postpartum appt (due to provider at 0820, new OB client scheduled and will be here same time as postpartum client and contract nurse first day back in [facility] and will not have immediate computer availability), call made to client to ascertain if could reschedule post-partum appt. Post-partum appt scheduled for 05/20/23. Burnadette Lowers, RN

## 2023-05-14 ENCOUNTER — Ambulatory Visit: Payer: Medicaid Other

## 2023-05-20 ENCOUNTER — Encounter: Payer: Self-pay | Admitting: Advanced Practice Midwife

## 2023-05-20 ENCOUNTER — Ambulatory Visit: Payer: Medicaid Other | Admitting: Advanced Practice Midwife

## 2023-05-20 LAB — HEMOGLOBIN, FINGERSTICK: Hemoglobin: 13.1 g/dL (ref 11.1–15.9)

## 2023-05-20 MED ORDER — NORETHINDRONE 0.35 MG PO TABS: 1.0000 | ORAL_TABLET | Freq: Every day | ORAL | 13 refills | Status: AC

## 2023-05-20 NOTE — Progress Notes (Signed)
 [facility] Department  Postpartum Exam  Toni Black is a 38 y.o. SBF nonsmoker [id number] (8,5,2, 17, 15,10) female who presents for a postpartum visit. She is 9 weeks postpartum following a normal spontaneous vaginal delivery on 03/12/23 M 7# over second degree laceration.  I have fully reviewed the prenatal and intrapartum course. The delivery was at 40.0 gestational weeks.  Anesthesia: none. Postpartum course has been wnl. Baby is doing well. Baby is feeding by bottle - Carnation Good Start. Bleeding no bleeding. Bowel function is normal. Bladder function is normal. Patient is not sexually active. Contraception method is abstinence. Postpartum depression screening: negative.  Bottlefeeding infant q 3 hours (3 oz) and boyfriend helps her with kids. Baby weighed 12 lbs on 05/16/23. LMP 05/09/23. Denies pp coitus. No more lochia. Going back to work full time 05/28/23. Ex husband is now in prison in [facility].  Denies cigs, vaping, cigars, MJ, ETOH. Pt wants nonhormonal  birth control and opts for Micronor  today but second choice is Paraguard.   The pregnancy intention screening data noted above was reviewed. Potential methods of contraception were discussed. The patient elected to proceed with No data recorded.    Health Maintenance Due  Topic Date Due   INFLUENZA VACCINE  Never done   COVID-19 Vaccine (1 - 2024-25 season) Never done    The following portions of the patient's history were reviewed and updated as appropriate: allergies, current medications, past family history, past medical history, past social history, past surgical history, and problem list.  Review of Systems Pertinent items are noted in HPI.  Objective:  BP 122/79 (BP Location: Right Arm, Patient Position: Sitting, Cuff Size: Normal)   Pulse 92   Ht 5' (1.524 m)   Wt 145 lb 9.6 oz (66 kg)   LMP 05/09/2023   BMI 28.44 kg/m    General:  alert   Breasts:  not indicated  Lungs: clear to auscultation bilaterally   Heart:  regular rate and rhythm, S1, S2 normal, no murmur, click, rub or gallop  Abdomen: soft, non-tender; bowel sounds normal; no masses,  no organomegaly   Wound N/a  GU exam:  normal      Pt declines chaperone for exam today Assessment:    1. Postpartum exam Pt desires Micronor  #13 (doesn't want hormones) and if changes mind will come in for Paraguard IUD Please give pt contact info for counseling with Alan Hail, LCSW Strongly encouraged dental exam--dental list given  9 wks postpartum exam.   Plan:   Essential components of care per ACOG recommendations:  1.  Mood and well being: Patient with negative depression screening today. Reviewed local resources for support.  - Patient tobacco use? No.   - hx of drug use? No.    2. Infant care and feeding:  -Patient currently breastmilk feeding? No.  -Social determinants of health (SDOH) reviewed in EPIC. No concernsThe following needs were identified: f/u anemia  3. Sexuality, contraception and birth spacing - Patient does not want a pregnancy in the next year.  Desired family size is 7 children.  - Reviewed reproductive life planning. Reviewed options based on patient desire and reproductive life plan. Patient is interested in Oral Contraceptive. This was provided to the patient today.  if not why not clearly documented  Risks, benefits, and typical effectiveness rates were reviewed.  Questions were answered.  Written information was also given to the patient to review.    The patient will follow up in  prn  for surveillance.  The patient was told to call with any further questions, or with any concerns about this method of contraception.  Emphasized use of condoms 100% of the time for STI prevention.  Patient was offered ECP based on not meeting criteria.  Patient is within 0 days of unprotected sex. Patient was offered ECP. Reviewed options and patient desired No method of ECP, declined all    - Discussed birth spacing of 18  months  4. Sleep and fatigue -Encouraged family/partner/community support of 4 hrs of uninterrupted sleep to help with mood and fatigue  5. Physical Recovery  - Discussed patients delivery and complications. She describes her labor as good. - Patient had a Vaginal, no problems at delivery. Patient had a 2nd degree laceration. Perineal healing reviewed. Patient expressed understanding - Patient has urinary incontinence? NO - Patient is safe to resume physical and sexual activity  6.  Health Maintenance - HM due items addressed Yes - Last pap smear No results found for: DIAGPAP Pap smear not done at today's visit.  -Breast Cancer screening indicated? No.   7. Chronic Disease/Pregnancy Condition follow up: Anemia  - PCP follow up  Toni Black, CNM [facility] Department

## 2023-05-20 NOTE — Progress Notes (Signed)
 Pt is here for PE and BCM.  Pt notified of normal Hgb results.  FP packet given to pt.  Berdie Ogren, RN

## 2023-06-18 ENCOUNTER — Other Ambulatory Visit: Payer: Self-pay | Admitting: Family Medicine

## 2023-06-18 NOTE — Progress Notes (Signed)
Client walked in with return to work forms and letter needing to be updated by provider. Patient given updated letter (done by Dr. Larita Fife) and return to work documents completed and faxed by Dr. Larita Fife. Client signed ROI with permission to fax paperwork. Burt Knack, RN

## 2023-06-18 NOTE — Progress Notes (Addendum)
Encounter for new return to work letter.   Return to work paperwork itself will be completed later and faxed directly to the employer per [name]'s request.   Fayette Pho, MD 06/18/23  1:42 PM  ADDENDUM Return to work paperwork completed. Faxed to employer at [phone] and [phone] myself. One copy placed in scanning inbox for scanning into her chart. The original copy was placed in an envelope for mailing back to [name] so she may have for her records.   Fayette Pho, MD 06/18/23  1:59 PM

## 2023-10-02 ENCOUNTER — Telehealth: Payer: Self-pay | Admitting: Family Medicine

## 2023-10-02 NOTE — Telephone Encounter (Signed)
 Patient called asking for a call back in regards to concerns with bc pills. Patient stated they had a period at the beginning of this month and shouldn't have another one until June but has started bleeding again. Wants to know if this could be due to bc pills. Has no mychart and would like to speak to someone.

## 2023-10-07 ENCOUNTER — Ambulatory Visit: Admitting: Nurse Practitioner

## 2023-10-07 ENCOUNTER — Encounter: Payer: Self-pay | Admitting: Nurse Practitioner

## 2023-10-07 VITALS — BP 123/78 | HR 87 | Ht 60.0 in | Wt 154.2 lb

## 2023-10-07 DIAGNOSIS — Z309 Encounter for contraceptive management, unspecified: Secondary | ICD-10-CM

## 2023-10-07 DIAGNOSIS — Z3009 Encounter for other general counseling and advice on contraception: Secondary | ICD-10-CM

## 2023-10-07 NOTE — Progress Notes (Signed)
 [facility] Problem Visit  [facility]  Subjective:  Toni Black is a 38 y.o. being seen today for   Chief Complaint  Patient presents with  . Acute Visit    Patient is a pleasant 38 y.o. female who presents to the office today concerned about abnormal menstrual bleeding. Of note, the patient delivered a baby 03/12/2023 and was initially breast feeding. She had a PP appt in this office on 05/20/23 and was started on progestin-only-pills. She states since then she has had one period per month until May when she had two periods that month. She reports taking the pill at the same time every evening and has not missed a pill.  Patient states she would like reassurance that this does not mean something is wrong or that she is pregnant.  Patient does not desire to have STI testing today.   Health Maintenance Due  Topic Date Due  . COVID-19 Vaccine (1 - 2024-25 season) Never done   The following portions of the patient's history were reviewed and updated as appropriate: allergies, current medications, past family history, past medical history, past social history, past surgical history and problem list. Problem list updated.   See flowsheet for other program required questions.  Objective:   Vitals:   10/07/23 1119  BP: 123/78  Pulse: 87  Weight: 154 lb 3.2 oz (69.9 kg)  Height: 5' (1.524 m)    Physical Exam Nursing note reviewed.  Constitutional:      General: She is not in acute distress.    Appearance: Normal appearance.  HENT:     Mouth/Throat:     Lips: Pink. No lesions.  Eyes:     General: No scleral icterus.       Right eye: No discharge.        Left eye: No discharge.     Conjunctiva/sclera:     Right eye: Right conjunctiva is not injected. No exudate or hemorrhage.    Left eye: Left conjunctiva is not injected. No exudate or hemorrhage. Pulmonary:     Effort: Pulmonary effort is normal.  Genitourinary:    Comments:  Declined genital exam and STI testing.  Skin:    Comments: Skin tone appropriate for ethnicity. Assessed exposed areas only.    Neurological:     Mental Status: She is alert and oriented to person, place, and time.  Psychiatric:        Attention and Perception: Attention and perception normal.        Mood and Affect: Mood and affect normal.        Speech: Speech normal.        Behavior: Behavior normal. Behavior is cooperative.        Thought Content: Thought content normal.   Assessment and Plan:  Toni Black is a 38 y.o. female presenting to the [facility] Department for a Women's Health problem visit  1. Advised about oral contraception (Primary) Discussed typical and common side effects of POPs with patient, including unpredictable periods. Advised patient if she is taking the pill at the same exact time every 24 hours and is not missing pills she should not be at risk of pregnancy.  Offered to switch contraceptive method for patient and patient declined and stated she liked taking these pills. Provided patient with educational printed materials in native language regarding POPs.  Return in about 8 months (around 06/08/2024) for annual well-woman exam.  No future appointments.  Total time  with patient 20 minutes.   Merleen Stare, NP

## 2024-02-06 ENCOUNTER — Ambulatory Visit

## 2024-02-11 ENCOUNTER — Ambulatory Visit

## 2024-02-18 ENCOUNTER — Ambulatory Visit
